# Patient Record
Sex: Male | Born: 1965 | Race: Black or African American | Hispanic: No | Marital: Single | Smoking: Former smoker
Health system: Southern US, Community
[De-identification: ages and names within clinical notes are randomized; demographics above are authoritative.]

## PROBLEM LIST (undated history)

## (undated) DIAGNOSIS — F1910 Other psychoactive substance abuse, uncomplicated: Secondary | ICD-10-CM

## (undated) DIAGNOSIS — I10 Essential (primary) hypertension: Secondary | ICD-10-CM

## (undated) DIAGNOSIS — F0390 Unspecified dementia without behavioral disturbance: Secondary | ICD-10-CM

## (undated) DIAGNOSIS — I639 Cerebral infarction, unspecified: Secondary | ICD-10-CM

## (undated) HISTORY — PX: APPENDECTOMY: SHX54

---

## 2020-12-23 ENCOUNTER — Other Ambulatory Visit: Payer: Self-pay

## 2020-12-23 ENCOUNTER — Encounter (HOSPITAL_COMMUNITY): Payer: Self-pay

## 2020-12-23 ENCOUNTER — Emergency Department (HOSPITAL_COMMUNITY): Payer: No Typology Code available for payment source

## 2020-12-23 ENCOUNTER — Emergency Department (HOSPITAL_COMMUNITY)
Admission: EM | Admit: 2020-12-23 | Discharge: 2020-12-23 | Disposition: A | Discharge status: Home / Self Care | Payer: No Typology Code available for payment source | Attending: Emergency Medicine | Admitting: Emergency Medicine

## 2020-12-23 DIAGNOSIS — R404 Transient alteration of awareness: Secondary | ICD-10-CM | POA: Insufficient documentation

## 2020-12-23 DIAGNOSIS — I10 Essential (primary) hypertension: Secondary | ICD-10-CM | POA: Insufficient documentation

## 2020-12-23 DIAGNOSIS — F0390 Unspecified dementia without behavioral disturbance: Secondary | ICD-10-CM | POA: Insufficient documentation

## 2020-12-23 DIAGNOSIS — Z87891 Personal history of nicotine dependence: Secondary | ICD-10-CM | POA: Insufficient documentation

## 2020-12-23 DIAGNOSIS — R4182 Altered mental status, unspecified: Secondary | ICD-10-CM | POA: Diagnosis present

## 2020-12-23 HISTORY — DX: Cerebral infarction, unspecified: I63.9

## 2020-12-23 HISTORY — DX: Other psychoactive substance abuse, uncomplicated: F19.10

## 2020-12-23 HISTORY — DX: Essential (primary) hypertension: I10

## 2020-12-23 LAB — AMMONIA: Ammonia: 21 umol/L (ref 9–35)

## 2020-12-23 LAB — CBC WITH DIFFERENTIAL/PLATELET
Abs Immature Granulocytes: 0.01 10*3/uL (ref 0.00–0.07)
Basophils Absolute: 0 10*3/uL (ref 0.0–0.1)
Basophils Relative: 0 %
Eosinophils Absolute: 0.1 10*3/uL (ref 0.0–0.5)
Eosinophils Relative: 1 %
HCT: 39.9 % (ref 39.0–52.0)
Hemoglobin: 12.6 g/dL — ABNORMAL LOW (ref 13.0–17.0)
Immature Granulocytes: 0 %
Lymphocytes Relative: 40 %
Lymphs Abs: 4 10*3/uL (ref 0.7–4.0)
MCH: 30.1 pg (ref 26.0–34.0)
MCHC: 31.6 g/dL (ref 30.0–36.0)
MCV: 95.5 fL (ref 80.0–100.0)
Monocytes Absolute: 0.8 10*3/uL (ref 0.1–1.0)
Monocytes Relative: 8 %
Neutro Abs: 5.2 10*3/uL (ref 1.7–7.7)
Neutrophils Relative %: 51 %
Platelets: 260 10*3/uL (ref 150–400)
RBC: 4.18 MIL/uL — ABNORMAL LOW (ref 4.22–5.81)
RDW: 13 % (ref 11.5–15.5)
WBC: 10.2 10*3/uL (ref 4.0–10.5)
nRBC: 0 % (ref 0.0–0.2)

## 2020-12-23 LAB — VALPROIC ACID LEVEL: Valproic Acid Lvl: <10 ug/mL — ABNORMAL LOW (ref 50.0–100.0)

## 2020-12-23 LAB — URINALYSIS, COMPLETE (UACMP) WITH MICROSCOPIC
Bacteria, UA: NONE SEEN
Bilirubin Urine: NEGATIVE
Glucose, UA: NEGATIVE mg/dL
Hgb urine dipstick: NEGATIVE
Ketones, ur: NEGATIVE mg/dL
Leukocytes,Ua: NEGATIVE
Nitrite: NEGATIVE
Protein, ur: NEGATIVE mg/dL
Specific Gravity, Urine: 1.009 (ref 1.005–1.030)
pH: 8 (ref 5.0–8.0)

## 2020-12-23 LAB — COMPREHENSIVE METABOLIC PANEL
ALT: 62 U/L — ABNORMAL HIGH (ref 0–44)
AST: 26 U/L (ref 15–41)
Albumin: 4.2 g/dL (ref 3.5–5.0)
Alkaline Phosphatase: 82 U/L (ref 38–126)
Anion gap: 6 (ref 5–15)
BUN: 15 mg/dL (ref 6–20)
CO2: 32 mmol/L (ref 22–32)
Calcium: 9.2 mg/dL (ref 8.9–10.3)
Chloride: 106 mmol/L (ref 98–111)
Creatinine, Ser: 0.84 mg/dL (ref 0.61–1.24)
GFR, Estimated: >60 mL/min (ref >60)
Glucose, Bld: 80 mg/dL (ref 70–99)
Potassium: 3.8 mmol/L (ref 3.5–5.1)
Sodium: 144 mmol/L (ref 135–145)
Total Bilirubin: 1.3 mg/dL — ABNORMAL HIGH (ref 0.3–1.2)
Total Protein: 7.1 g/dL (ref 6.5–8.1)

## 2020-12-23 LAB — RAPID URINE DRUG SCREEN, HOSP PERFORMED
Amphetamines: NOT DETECTED
Barbiturates: NOT DETECTED
Benzodiazepines: NOT DETECTED
Cocaine: NOT DETECTED
Opiates: NOT DETECTED
Tetrahydrocannabinol: NOT DETECTED

## 2020-12-23 LAB — CBG MONITORING, ED
Glucose-Capillary: 69 mg/dL — ABNORMAL LOW (ref 70–99)
Glucose-Capillary: 77 mg/dL (ref 70–99)

## 2020-12-23 LAB — ETHANOL: Alcohol, Ethyl (B): <10 mg/dL (ref <10)

## 2020-12-23 NOTE — Progress Notes (Addendum)
.  Transition of Care [facility]) - Emergency Department Mini Assessment   Patient Details  Name: Patrick Villarreal MRN: [id number] Date of Birth: 05-Aug-1966  Transition of Care [facility]) CM/SW Contact:    Elliot Cousin, RN Phone Number: [phone] 12/23/2020, 7:32 PM   Clinical Narrative: TOC CM spoke to pt and pt's sister, Kendal Hymen. Kendal Hymen states pt will be staying with her after his stroke. He moved from [place] to [facility] and she has been managing him at home. States she has attempted several times to establish care for him at [facility] but has not been able to get an appt. Explained they do have an ED at that center. Explained CM will fax his progress notes and facesheet to [facility] to have SW arrange follow up and arrange HH. ED provider updated.   Attempted call to [facility] CSW, Salvadore Dom [phone] ext [phone], no voice mail as it is after hours for that clinic.    ED Mini Assessment: What brought you to the Emergency Department? : AMS  Barriers to Discharge: No Barriers Identified  Barrier interventions: faxed referral to [facility] to arrange PCP, and Neurology appts  Means of departure: Car  Interventions which prevented an admission or readmission: Follow-up medical appointment    Patient Contact and Communications Key Contact 1: Laymond Purser   Spoke with: sister Contact Date: 12/23/20,   Contact time: 1926 Contact Phone Number: [phone]           Admission diagnosis:  AMS There are no problems to display for this patient.  PCP:  Pcp, No Pharmacy:  No Pharmacies Listed

## 2020-12-23 NOTE — Discharge Instructions (Addendum)
Follow-up with neurology, call to schedule an appointment.  [facility] [place] [facility]  Return to the emergency room for any worsening or concerning symptoms.

## 2020-12-23 NOTE — ED Provider Notes (Signed)
Physical Exam  BP (!) 176/102 Comment: Simultaneous filing. User may not have seen previous data.  Pulse 67 Comment: Simultaneous filing. User may not have seen previous data.  Temp 98.6 F (37 C) (Oral)   Resp 20 Comment: Simultaneous filing. User may not have seen previous data.  Ht 6\' 1"  (1.854 m)   Wt 86.2 kg   SpO2 100% Comment: Simultaneous filing. User may not have seen previous data.  BMI 25.07 kg/m   Physical Exam Vitals and nursing note reviewed.  Constitutional:      General: He is not in acute distress.    Appearance: He is well-developed. He is not diaphoretic.  HENT:     Head: Normocephalic and atraumatic.  Eyes:     General: No scleral icterus.    Conjunctiva/sclera: Conjunctivae normal.  Pulmonary:     Effort: Pulmonary effort is normal. No respiratory distress.  Musculoskeletal:     Cervical back: Normal range of motion.  Skin:    Findings: No rash.  Neurological:     Mental Status: He is alert.     ED Course/Procedures   Clinical Course as of 12/23/20 12/25/20 Dec 23, 2020  1232 55 year old male brought in by sister from home for change in mental status although arrives to the emergency room back to baseline today.  CT head shows old stroke, no acute findings.  Work-up today has been unrevealing including CBC, CMP, normal ammonia and negative ethanol.  Patient is pending UDS and throat level as well as UA to evaluate for infection. Case discussed with Dr. 59, ER attending, consider possible new seizure development due to prior stroke.  Plan is to refer to neurology for outpatient follow-up.  Discussed available results and plan of care with patient's sister at bedside.  Care signed out pending remaining lab work. [LM]  1826 Nitrite: NEGATIVE [HK]  1826 Leukocytes,Ua: NEGATIVE [HK]  1826 Bacteria, UA: NONE SEEN [HK]  1826 Valproic Acid,S(!): <10 [HK]  1835 Urine rapid drug screen (hosp performed) All negative [HK]    Clinical Course User  Index [HK] Anitra Lauth, PA-C [LM] Dietrich Pates, PA-C    Procedures  MDM   Care of patient assumed from [facility] at 6 PM.  Agree with history, physical exam and plan.  See their note for further details.  Briefly, 55 y.o. male with PMH/PSH as below who presents with change in mental status.  History of stroke 3 years ago.  Last seen normal at 10 PM last night.  Sister came home from work at 10 AM today and noticed that patient was out of bed, fully dressed with unusual behavior.  Was not following commands. At baseline patient has a history of dementia and has the mentality of a child but is able to complete simple daily tasks. Sister states that he is back to baseline now. CT of the head shows old stroke without any acute findings. Normal CBC, CMP, ammonia level.  Negative ethanol level.  Past Medical History:  Diagnosis Date  . Hypertension   . Stroke (HCC)   . Substance abuse [facility])    Past Surgical History:  Procedure Laterality Date  . APPENDECTOMY        Current Plan: Obtain Depakote level as well as UA, UDS to evaluate for cause of altered mental status.   MDM/ED Course: 6:26 PM UA without evidence of infection. UDS negative. Depakote level is low at <10, but evaluated this to make sure level was not elevated. Patient  and sister at the bedside are updated on plan of care.  We will have him follow-up with his primary care provider and return for worsening symptoms. Sister is requesting a TOC consult to be followed up outpatient regarding getting her in touch with a PCP.  Consults: None   Significant labs/images: Labs Reviewed  COMPREHENSIVE METABOLIC PANEL - Abnormal; Notable for the following components:      Result Value   ALT 62 (*)    Total Bilirubin 1.3 (*)    All other components within normal limits  CBC WITH DIFFERENTIAL/PLATELET - Abnormal; Notable for the following components:   RBC 4.18 (*)    Hemoglobin 12.6 (*)    All other components within  normal limits  URINALYSIS, COMPLETE (UACMP) WITH MICROSCOPIC - Abnormal; Notable for the following components:   Color, Urine STRAW (*)    All other components within normal limits  VALPROIC ACID LEVEL - Abnormal; Notable for the following components:   Valproic Acid Lvl <10 (*)    All other components within normal limits  CBG MONITORING, ED - Abnormal; Notable for the following components:   Glucose-Capillary 69 (*)    All other components within normal limits  AMMONIA  ETHANOL  RAPID URINE DRUG SCREEN, HOSP PERFORMED  CBG MONITORING, ED    I personally reviewed and interpreted all labs.   Patient is hemodynamically stable, in NAD. Evaluation does not show pathology that would require ongoing emergent intervention or inpatient treatment. I explained the diagnosis to the patient. Pain has been managed and has no complaints prior to discharge. Patient is comfortable with above plan and is stable for discharge at this time. All questions were answered prior to disposition. Strict return precautions for returning to the ED were discussed. Encouraged follow up with PCP.   An After Visit Summary was printed and given to the patient.   Portions of this note were generated with Scientist, clinical (histocompatibility and immunogenetics). Dictation errors may occur despite best attempts at proofreading.      Dietrich Pates, PA-C 12/23/20 Harl Bowie, MD 12/24/20 [phone]

## 2020-12-23 NOTE — ED Provider Notes (Signed)
[facility]-EMERGENCY DEPT Provider Note   CSN: [id number] Arrival date & time: 12/23/20  1408     History Chief Complaint  Patient presents with  . Altered Mental Status    Patrick Villarreal is a 55 y.o. male.  55 year old male with past medical history of dementia, hypertension, stroke, substance abuse (as per sister is aware no IV drug use), brought in by sister for change in mental status.  Patient had a stroke 3 years ago, was living in a care facility in [place], sister recently just went to [place] and relocated patient to her home within the past month.  At baseline, history states patient is aware of who she is, has the mentality of a child but is able to complete simple daily tasks.  Patient was last seen normal at 10:00 last night.  Sister came home from work at 10:00am today and states patient was out of bed, normally does not wake up until 1 PM.  Patient had put on pants, underwear, shoes and socks and found a jacket however did not have a T-shirt on which struck her as unusual but she did not think much of it and went back to work.  Sister turned home at 12:00 and states that patient seemed confused and was not following commands.  At this time, states patient is back to baseline. Level 5 caveat applies due to dementia, patient said ability to provide his own history today.        Past Medical History:  Diagnosis Date  . Hypertension   . Stroke (HCC)   . Substance abuse (HCC)     There are no problems to display for this patient.   Past Surgical History:  Procedure Laterality Date  . APPENDECTOMY         History reviewed. No pertinent family history.  Social History   Tobacco Use  . Smoking status: Former Games developer  . Smokeless tobacco: Never Used    Home Medications Prior to Admission medications   Medication Sig Start Date End Date Taking? Authorizing Provider  atorvastatin (LIPITOR) 80 MG tablet Take 80 mg by mouth every evening.  07/13/20 07/14/21 Yes [provider]  clopidogrel (PLAVIX) 75 MG tablet Take 1 tablet by mouth daily. 07/13/20 07/14/21 Yes [provider]  divalproex (DEPAKOTE SPRINKLE) 125 MG capsule Take 125 mg by mouth 3 (three) times daily. 11/04/20 11/05/21 Yes [provider]  ibuprofen (ADVIL) 400 MG tablet Take 400 mg by mouth 2 (two) times daily as needed for moderate pain. 11/30/20  Yes [provider]  losartan (COZAAR) 25 MG tablet Take 1 tablet by mouth daily. 07/13/20 07/14/21 Yes [provider]  potassium chloride SA (KLOR-CON) 20 MEQ tablet Take 1 tablet by mouth daily. 07/09/20 07/10/21 Yes [provider]  QUEtiapine (SEROQUEL) 100 MG tablet Take 0.5 tablets by mouth at bedtime. 10/29/20  Yes [provider]    Allergies    Shellfish allergy and Iodine  Review of Systems   Review of Systems  Unable to perform ROS: Dementia    Physical Exam Updated Vital Signs BP (!) 194/109   Pulse 70   Temp 98.6 F (37 C) (Oral)   Resp 18   Ht 6\' 1"  (1.854 m)   Wt 86.2 kg   SpO2 100%   BMI 25.07 kg/m   Physical Exam Vitals and nursing note reviewed.  Constitutional:      General: He is not in acute distress.    Appearance: He  is not ill-appearing.  HENT:     Head: Normocephalic and atraumatic.     Nose: Nose normal.     Mouth/Throat:     Mouth: Mucous membranes are moist.  Eyes:     Extraocular Movements: Extraocular movements intact.     Conjunctiva/sclera: Conjunctivae normal.     Pupils: Pupils are equal, round, and reactive to light.  Cardiovascular:     Rate and Rhythm: Normal rate and regular rhythm.     Pulses: Normal pulses.     Heart sounds: Normal heart sounds.  Pulmonary:     Effort: Pulmonary effort is normal.     Breath sounds: Normal breath sounds.  Abdominal:     Palpations: Abdomen is soft.     Tenderness: There is no abdominal tenderness.  Musculoskeletal:        General: No swelling or tenderness.  Normal range of motion.     Cervical back: Neck supple.     Right lower leg: No edema.     Left lower leg: No edema.  Skin:    General: Skin is warm and dry.  Neurological:     General: No focal deficit present.     Mental Status: He is alert. Mental status is at baseline.     Cranial Nerves: No facial asymmetry.     Motor: No weakness or pronator drift.  Psychiatric:        Mood and Affect: Mood normal.        Behavior: Behavior normal.     ED Results / Procedures / Treatments   Labs (all labs ordered are listed, but only abnormal results are displayed) Labs Reviewed  COMPREHENSIVE METABOLIC PANEL - Abnormal; Notable for the following components:      Result Value   ALT 62 (*)    Total Bilirubin 1.3 (*)    All other components within normal limits  CBC WITH DIFFERENTIAL/PLATELET - Abnormal; Notable for the following components:   RBC 4.18 (*)    Hemoglobin 12.6 (*)    All other components within normal limits  CBG MONITORING, ED - Abnormal; Notable for the following components:   Glucose-Capillary 69 (*)    All other components within normal limits  AMMONIA  ETHANOL  URINALYSIS, COMPLETE (UACMP) WITH MICROSCOPIC  RAPID URINE DRUG SCREEN, HOSP PERFORMED  VALPROIC ACID LEVEL  CBG MONITORING, ED    EKG EKG Interpretation  Date/Time:  Thursday December 23 2020 14:16:59 EDT Ventricular Rate:  69 PR Interval:    QRS Duration: 99 QT Interval:  401 QTC Calculation: 430 R Axis:   59 Text Interpretation: Sinus rhythm Prolonged PR interval ST elevation, consider anterior injury No previous tracing Confirmed by Gwyneth Sprout ([id number]) on 12/23/2020 3:41:57 PM   Radiology CT HEAD WO CONTRAST  Result Date: 12/23/2020 CLINICAL DATA:  Altered mental status EXAM: CT HEAD WITHOUT CONTRAST TECHNIQUE: Contiguous axial images were obtained from the base of the skull through the vertex without intravenous contrast. COMPARISON:  None. FINDINGS: Brain: There is mild diffuse atrophy.  There is no intracranial mass, hemorrhage, extra-axial fluid collection, midline shift. There is evidence of an apparent prior infarct on the left involving portions of the posterior temporal lobe as well as portions of the anterior and mid left occipital lobe with extension of infarct to involve the mid left parietal lobe. There is evidence of a prior infarct involving the anterior right lentiform nucleus as well as portions of the adjacent internal and external capsules on the right. Elsewhere there  is mild small vessel disease in the centra semiovale bilaterally. No acute appearing infarct is demonstrable on this study. Vascular: No hyperdense vessel. No appreciable vascular calcification. Skull: Bony calvarium appears intact. Sinuses/Orbits: There is a small retention cyst along the anterior left maxillary antrum. There is also a retention cyst in the posterior left sphenoid sinus. Orbits appear symmetric bilaterally. Other: Mastoid air cells are clear. IMPRESSION: Mild diffuse atrophy. Prior infarct involving portions of the posterior left temporal lobe, anterior to mid left occipital lobe, and midportion of the left parietal lobe. Localized atrophy in this area noted. Prior infarct in the right lentiform nucleus anteriorly with involvement of portions of the internal and external capsules on the right. Periventricular small vessel disease noted. No acute infarct appreciable. No mass or hemorrhage. Scattered foci of paranasal sinus disease noted. Electronically Signed   By: Bretta Bang III M.D.   On: 12/23/2020 15:21   DG Chest Port 1 View  Result Date: 12/23/2020 CLINICAL DATA:  Altered mental status EXAM: PORTABLE CHEST 1 VIEW COMPARISON:  None. FINDINGS: There are scattered small calcified granulomas. No edema or airspace opacity. The heart size and pulmonary vascularity are normal. No adenopathy. No bone lesions. IMPRESSION: Several scattered small calcified granulomas. Lungs elsewhere clear.  Heart size normal. Electronically Signed   By: Bretta Bang III M.D.   On: 12/23/2020 15:22    Procedures Procedures   Medications Ordered in ED Medications - No data to display  ED Course  I have reviewed the triage vital signs and the nursing notes.  Pertinent labs & imaging results that were available during my care of the patient were reviewed by me and considered in my medical decision making (see chart for details).  Clinical Course as of 12/23/20 1756  Thu Dec 23, 2020  [id number] 55 year old male brought in by sister from home for change in mental status although arrives to the emergency room back to baseline today.  CT head shows old stroke, no acute findings.  Work-up today has been unrevealing including CBC, CMP, normal ammonia and negative ethanol.  Patient is pending UDS and throat level as well as UA to evaluate for infection. Case discussed with Dr. Anitra Lauth, ER attending, consider possible new seizure development due to prior stroke.  Plan is to refer to neurology for outpatient follow-up.  Discussed available results and plan of care with patient's sister at bedside.  Care signed out pending remaining lab work. [LM]    Clinical Course User Index [LM] Alden Hipp   MDM Rules/Calculators/A&P                          Final Clinical Impression(s) / ED Diagnoses Final diagnoses:  Transient alteration of awareness    Rx / DC Orders ED Discharge Orders    None       Jeannie Fend, PA-C 12/23/20 1756    Gwyneth Sprout, MD 12/27/20 [phone]

## 2020-12-23 NOTE — ED Notes (Signed)
Milk provided and swallow screen passed.

## 2020-12-23 NOTE — ED Notes (Signed)
PA in room at this time 

## 2020-12-23 NOTE — ED Triage Notes (Addendum)
Per sister, patient has hx of stroke. Sister said he was baseline when he went to bed at 2200 and upon waking this am patient dressed hisself. Sister come home from lunch at 1200 and pt was confused, shaking, not following commands.

## 2020-12-23 NOTE — ED Notes (Signed)
Called social worker to make aware of consult. She is okay with patient being discharged. She will follow up

## 2020-12-24 NOTE — Progress Notes (Signed)
12/24/2020 @ 11:40am  TOC CM CSW received a call from Amy [facility]att[facility].  Amy stated that [facility] would accept pt and pass this message onto Cathlean Cower, Charity fundraiser.  CSW provided Cathlean Cower, RN with this information.  CSW will continue to follow for dc needs.  Tashiba Timoney Tarpley-Carter, MSW, LCSW-A Pronouns:  She, Her, Hers                  [facility] Transitions of CareClinical Social Worker [email or web address] [phone]

## 2021-02-22 ENCOUNTER — Emergency Department (HOSPITAL_BASED_OUTPATIENT_CLINIC_OR_DEPARTMENT_OTHER): Payer: No Typology Code available for payment source

## 2021-02-22 ENCOUNTER — Emergency Department (HOSPITAL_BASED_OUTPATIENT_CLINIC_OR_DEPARTMENT_OTHER)
Admission: EM | Admit: 2021-02-22 | Discharge: 2021-02-22 | Discharge status: Against Medical Advice | Payer: No Typology Code available for payment source | Attending: Emergency Medicine | Admitting: Emergency Medicine

## 2021-02-22 ENCOUNTER — Other Ambulatory Visit: Payer: Self-pay

## 2021-02-22 ENCOUNTER — Encounter (HOSPITAL_BASED_OUTPATIENT_CLINIC_OR_DEPARTMENT_OTHER): Payer: Self-pay | Admitting: Emergency Medicine

## 2021-02-22 DIAGNOSIS — Z87891 Personal history of nicotine dependence: Secondary | ICD-10-CM | POA: Diagnosis not present

## 2021-02-22 DIAGNOSIS — Z7902 Long term (current) use of antithrombotics/antiplatelets: Secondary | ICD-10-CM | POA: Diagnosis not present

## 2021-02-22 DIAGNOSIS — F0390 Unspecified dementia without behavioral disturbance: Secondary | ICD-10-CM | POA: Diagnosis not present

## 2021-02-22 DIAGNOSIS — H539 Unspecified visual disturbance: Secondary | ICD-10-CM

## 2021-02-22 DIAGNOSIS — Z79899 Other long term (current) drug therapy: Secondary | ICD-10-CM | POA: Insufficient documentation

## 2021-02-22 DIAGNOSIS — H538 Other visual disturbances: Secondary | ICD-10-CM | POA: Diagnosis present

## 2021-02-22 DIAGNOSIS — I10 Essential (primary) hypertension: Secondary | ICD-10-CM | POA: Diagnosis not present

## 2021-02-22 LAB — DIFFERENTIAL
Abs Immature Granulocytes: 0.02 10*3/uL (ref 0.00–0.07)
Basophils Absolute: 0.1 10*3/uL (ref 0.0–0.1)
Basophils Relative: 1 %
Eosinophils Absolute: 0.2 10*3/uL (ref 0.0–0.5)
Eosinophils Relative: 2 %
Immature Granulocytes: 0 %
Lymphocytes Relative: 40 %
Lymphs Abs: 3.4 10*3/uL (ref 0.7–4.0)
Monocytes Absolute: 0.7 10*3/uL (ref 0.1–1.0)
Monocytes Relative: 8 %
Neutro Abs: 4.2 10*3/uL (ref 1.7–7.7)
Neutrophils Relative %: 49 %

## 2021-02-22 LAB — COMPREHENSIVE METABOLIC PANEL
ALT: 24 U/L (ref 0–44)
AST: 15 U/L (ref 15–41)
Albumin: 4.5 g/dL (ref 3.5–5.0)
Alkaline Phosphatase: 66 U/L (ref 38–126)
Anion gap: 8 (ref 5–15)
BUN: 18 mg/dL (ref 6–20)
CO2: 33 mmol/L — ABNORMAL HIGH (ref 22–32)
Calcium: 9.6 mg/dL (ref 8.9–10.3)
Chloride: 101 mmol/L (ref 98–111)
Creatinine, Ser: 0.84 mg/dL (ref 0.61–1.24)
GFR, Estimated: >60 mL/min (ref >60)
Glucose, Bld: 96 mg/dL (ref 70–99)
Potassium: 3.4 mmol/L — ABNORMAL LOW (ref 3.5–5.1)
Sodium: 142 mmol/L (ref 135–145)
Total Bilirubin: 1 mg/dL (ref 0.3–1.2)
Total Protein: 7.4 g/dL (ref 6.5–8.1)

## 2021-02-22 LAB — CBC
HCT: 43.5 % (ref 39.0–52.0)
Hemoglobin: 13.8 g/dL (ref 13.0–17.0)
MCH: 29.7 pg (ref 26.0–34.0)
MCHC: 31.7 g/dL (ref 30.0–36.0)
MCV: 93.8 fL (ref 80.0–100.0)
Platelets: 270 10*3/uL (ref 150–400)
RBC: 4.64 MIL/uL (ref 4.22–5.81)
RDW: 12.5 % (ref 11.5–15.5)
WBC: 8.4 10*3/uL (ref 4.0–10.5)
nRBC: 0 % (ref 0.0–0.2)

## 2021-02-22 LAB — APTT: aPTT: 34 seconds (ref 24–36)

## 2021-02-22 LAB — CBG MONITORING, ED: Glucose-Capillary: 96 mg/dL (ref 70–99)

## 2021-02-22 LAB — PROTIME-INR
INR: 1 (ref 0.8–1.2)
Prothrombin Time: 13.2 seconds (ref 11.4–15.2)

## 2021-02-22 MED ORDER — SODIUM CHLORIDE 0.9% FLUSH
3.0000 mL | Freq: Once | INTRAVENOUS | Status: AC
  Administered 2021-02-22: 3 mL via INTRAVENOUS
  Filled 2021-02-22: qty 3

## 2021-02-22 NOTE — ED Notes (Signed)
Pt sister stated she would like to go to the [place] tomorrow due to long wait time. Notified provider to speak with them.

## 2021-02-22 NOTE — ED Notes (Signed)
Sister states patient has had "vision" changes on Saturday which resolved and then again yesterday.  States just not able to see as well as usual.  Say patient also had some weakness and was very tired on Saturday.  Patient normally confused and has history of dementia and past stroke.

## 2021-02-22 NOTE — ED Triage Notes (Signed)
Pt arrives to ED accompanied by sister with c/o of episodes of blurred vision yesterday and today. Pt reports no blurred vision at this time. Pt is poor historian and unable to provide history of his own due to hx of dementia and stroke. Pt is unable to give exact LKN or how long these episodes of blurred vision lasted. NO weakness noted.

## 2021-02-22 NOTE — ED Notes (Signed)
Patient transported to CT 

## 2021-02-22 NOTE — ED Provider Notes (Addendum)
[facility] EMERGENCY DEPT Provider Note   CSN: [id number] Arrival date & time: 02/22/21  1301     History Chief Complaint  Patient presents with  . Blurred Vision    Patrick Villarreal is a 55 y.o. male.  HPI   55 year old male with past medical history of HTN, multiple strokes in the past with unknown residual deficits, substance abuse, dementia presents to the emergency department with concern for vision changes.  Patient is a very poor historian, story is inconsistent and sometimes he just looks to his sister to answer the questions.  He is not fully cooperative.  The sister at bedside states that for the past couple days he has intermittently been saying that he cannot see it.  He has not specified whether this is vision loss or blurred vision.  He is also not specified to her if this is in 1 eye or both eyes.  He has not been complaining of any headache or nausea/vomiting.  Has not had any noted fever.  Past Medical History:  Diagnosis Date  . Hypertension   . Stroke (HCC)   . Substance abuse (HCC)     There are no problems to display for this patient.   Past Surgical History:  Procedure Laterality Date  . APPENDECTOMY         History reviewed. No pertinent family history.  Social History   Tobacco Use  . Smoking status: Former Games developer  . Smokeless tobacco: Never Used    Home Medications Prior to Admission medications   Medication Sig Start Date End Date Taking? Authorizing Provider  atorvastatin (LIPITOR) 80 MG tablet Take 80 mg by mouth every evening. 07/13/20 07/14/21  [provider]  clopidogrel (PLAVIX) 75 MG tablet Take 1 tablet by mouth daily. 07/13/20 07/14/21  [provider]  divalproex (DEPAKOTE SPRINKLE) 125 MG capsule Take 125 mg by mouth 3 (three) times daily. 11/04/20 11/05/21  [provider]  ibuprofen (ADVIL) 400 MG tablet Take 400 mg by mouth 2 (two) times daily as needed for moderate pain. 11/30/20    [provider]  losartan (COZAAR) 25 MG tablet Take 1 tablet by mouth daily. 07/13/20 07/14/21  [provider]  potassium chloride SA (KLOR-CON) 20 MEQ tablet Take 1 tablet by mouth daily. 07/09/20 07/10/21  [provider]  QUEtiapine (SEROQUEL) 100 MG tablet Take 0.5 tablets by mouth at bedtime. 10/29/20   [provider]    Allergies    Shellfish allergy and Iodine  Review of Systems   Review of Systems  Unable to perform ROS: Dementia    Physical Exam Updated Vital Signs BP (!) 178/100   Pulse 70   Temp 98.2 F (36.8 C) (Oral)   Resp 13   Ht 6\' 1"  (1.854 m)   Wt 90.7 kg   SpO2 100%   BMI 26.39 kg/m   Physical Exam Vitals and nursing note reviewed.  Constitutional:      Appearance: Normal appearance.  HENT:     Head: Normocephalic.     Mouth/Throat:     Mouth: Mucous membranes are moist.  Eyes:     Pupils: Pupils are equal, round, and reactive to light.     Comments: Patient appears to respond to visual threat bilaterally, his extraocular movements are full, he localizes to my face when I speak, is able to identify many fingers I am holding up, does not appear to have any acute visual deficit  Cardiovascular:     Rate and  Rhythm: Normal rate.  Pulmonary:     Effort: Pulmonary effort is normal. No respiratory distress.  Abdominal:     Palpations: Abdomen is soft.     Tenderness: There is no abdominal tenderness.  Skin:    General: Skin is warm.  Neurological:     Mental Status: He is alert. Mental status is at baseline.     Comments: Sister states that the patient is in his baseline demented status, he is confused, knows his name but then does not cooperate with the rest of the orientation exam, intermittently follows commands, appears to have vision intact at this time.  Psychiatric:        Mood and Affect: Mood normal.     ED Results / Procedures / Treatments   Labs (all labs ordered are listed, but only abnormal results  are displayed) Labs Reviewed  COMPREHENSIVE METABOLIC PANEL - Abnormal; Notable for the following components:      Result Value   Potassium 3.4 (*)    CO2 33 (*)    All other components within normal limits  PROTIME-INR  APTT  CBC  DIFFERENTIAL  CBG MONITORING, ED  CBG MONITORING, ED    EKG EKG Interpretation  Date/Time:  Tuesday Feb 22 2021 13:09:52 EDT Ventricular Rate:  79 PR Interval:  219 QRS Duration: 95 QT Interval:  380 QTC Calculation: 436 R Axis:   37 Text Interpretation: Sinus rhythm Prolonged PR interval Probable left ventricular hypertrophy ST elevation, consider anterior injury NSR, STE present before Confirmed by Coralee Pesa [phone]) on 02/22/2021 1:29:08 PM   Radiology CT HEAD WO CONTRAST  Result Date: 02/22/2021 CLINICAL DATA:  Neuro deficit, acute, stroke suspected. Additional history provided: Blurred vision. EXAM: CT HEAD WITHOUT CONTRAST TECHNIQUE: Contiguous axial images were obtained from the base of the skull through the vertex without intravenous contrast. COMPARISON:  Prior head CT 12/23/2020. FINDINGS: Brain: Mild generalized parenchymal atrophy. Redemonstrated chronic cortical/subcortical infarct within the left parietal, temporal and occipital lobes. Ex vacuo dilatation of the left lateral ventricle. Redemonstrated chronic infarct within the right corona radiata, basal ganglia and internal capsule. Background mild ill-defined hypoattenuation within the cerebral white matter, nonspecific but compatible with chronic small vessel ischemic disease. There is no acute intracranial hemorrhage. No acute demarcated cortical infarct. No extra-axial fluid collection. No evidence of intracranial mass. No midline shift. Vascular: No hyperdense vessel. Skull: Normal. Negative for fracture or focal lesion. Sinuses/Orbits: Visualized orbits show no acute finding. Small mucous retention cysts within the right frontal and left ethmoid sinuses. Background trace scattered  mucosal thickening within the bilateral ethmoid sinuses. Trace left sphenoid sinus mucosal thickening. Trace mucosal thickening within the bilateral maxillary sinuses at the imaged levels. IMPRESSION: No evidence of acute intracranial abnormality. Redemonstrated chronic cortical/subcortical left MCA territory infarct within the left parietal, temporal and occipital lobes. Redemonstrated chronic infarct within the right corona radiata, basal ganglia and internal capsule. Stable background mild generalized parenchymal atrophy and cerebral white matter chronic small vessel ischemic disease. Paranasal sinus disease, as described. Electronically Signed   By: Jackey Loge DO   On: 02/22/2021 14:19    Procedures Procedures   Medications Ordered in ED Medications  sodium chloride flush (NS) 0.9 % injection 3 mL (3 mLs Intravenous Given 02/22/21 1327)    ED Course  I have reviewed the triage vital signs and the nursing notes.  Pertinent labs & imaging results that were available during my care of the patient were reviewed by me and considered in my medical  decision making (see chart for details).    MDM Rules/Calculators/A&P                          55 year old male presents the emergency department with reported vision changes.  He has an extensive history of strokes.  The patient is a very poor historian, intermittently following commands, at times rude or just uncooperative.  Sister at bedside states over the past couple days he is intermittently been saying that he could not see, but then the sister states that it seems to resolve because he operates an has been functioning normally.  It is unclear if this is 1 eye, both eyes, vision loss or blurred vision.  When I ask him about earlier this morning when he told his sister that he could not see if he remembered this he said yes, when I asked him to describe this event he says no.  When I asked with vision changes in 1 or both eyes he says both eyes.   He appears to respond to visual threat bilaterally, localizes to my face no problem, identifies things on visual acuity.  Does not appear to have any acute vision change at this time.  He does not appear to have any other focal deficit, he ambulated to the bathroom with assistance with his sister which she states is baseline.  Work-up here is reassuring, head CT shows old strokes without any acute findings.  However given his history with comorbidities and possible vision changes plan to transfer to [facility] for MRI for further evaluation.  Lower suspicion for an ocular cause given that his complaints is both eyes and his exam here is unremarkable.  Dr. Myrtis Ser is the accepting physician.  Patient stable at time of transfer.  Patient is going by private vehicle.  Final Clinical Impression(s) / ED Diagnoses Final diagnoses:  Vision changes    Rx / DC Orders ED Discharge Orders    None       Rozelle Logan, DO 02/22/21 1537    Amandine Covino, Clabe Seal, DO 02/22/21 1540

## 2021-02-22 NOTE — ED Notes (Signed)
Handoff report given to Alycia Rossetti RN at [facility] ER for transfer

## 2021-02-22 NOTE — ED Notes (Signed)
Patient transfer to [facility] ER Accepting by Dr. Myrtis Ser.  Sister taking patient by private car.

## 2021-02-22 NOTE — ED Provider Notes (Signed)
Patient seen upon arrival to [facility], ED.  Patient was sent from [facility] for MRI of brain.  Patient is currently without acute complaint.  Specifically, patient is currently denying visual change or loss.  Patient's family (sister) is at bedside.  She reports that the patient is at his neurologic baseline.   MRI Brain pending.     1800 Patient is still awaiting MRI.  Patient and his sister do not want to wait any further.  They desire AMA.  They understand the risk inherent in leaving prior to completion of ED work-up.  They have plan for follow-up with the PCP at the [facility] system.  They also have already scheduled outpatient follow-up with optometry within the [place] system as well.  At time of leaving AMA the patient is reporting no symptoms.   Wynetta Fines, MD 02/22/21 [phone]

## 2021-02-22 NOTE — Discharge Instructions (Addendum)
Return for any problem.  ?

## 2021-05-18 ENCOUNTER — Emergency Department (HOSPITAL_COMMUNITY)
Admission: EM | Admit: 2021-05-18 | Discharge: 2021-05-19 | Disposition: A | Discharge status: Home / Self Care | Payer: No Typology Code available for payment source | Attending: Emergency Medicine | Admitting: Emergency Medicine

## 2021-05-18 ENCOUNTER — Encounter (HOSPITAL_COMMUNITY): Payer: Self-pay

## 2021-05-18 ENCOUNTER — Other Ambulatory Visit: Payer: Self-pay

## 2021-05-18 DIAGNOSIS — Z7902 Long term (current) use of antithrombotics/antiplatelets: Secondary | ICD-10-CM | POA: Diagnosis not present

## 2021-05-18 DIAGNOSIS — Z87891 Personal history of nicotine dependence: Secondary | ICD-10-CM | POA: Insufficient documentation

## 2021-05-18 DIAGNOSIS — Z79899 Other long term (current) drug therapy: Secondary | ICD-10-CM | POA: Insufficient documentation

## 2021-05-18 DIAGNOSIS — R319 Hematuria, unspecified: Secondary | ICD-10-CM | POA: Diagnosis present

## 2021-05-18 DIAGNOSIS — I10 Essential (primary) hypertension: Secondary | ICD-10-CM | POA: Diagnosis not present

## 2021-05-18 DIAGNOSIS — N3001 Acute cystitis with hematuria: Secondary | ICD-10-CM | POA: Insufficient documentation

## 2021-05-18 LAB — COMPREHENSIVE METABOLIC PANEL
ALT: 26 U/L (ref 0–44)
AST: 20 U/L (ref 15–41)
Albumin: 4.3 g/dL (ref 3.5–5.0)
Alkaline Phosphatase: 74 U/L (ref 38–126)
Anion gap: 8 (ref 5–15)
BUN: 19 mg/dL (ref 6–20)
CO2: 32 mmol/L (ref 22–32)
Calcium: 9.2 mg/dL (ref 8.9–10.3)
Chloride: 103 mmol/L (ref 98–111)
Creatinine, Ser: 0.91 mg/dL (ref 0.61–1.24)
GFR, Estimated: >60 mL/min (ref >60)
Glucose, Bld: 121 mg/dL — ABNORMAL HIGH (ref 70–99)
Potassium: 3.1 mmol/L — ABNORMAL LOW (ref 3.5–5.1)
Sodium: 143 mmol/L (ref 135–145)
Total Bilirubin: 2.2 mg/dL — ABNORMAL HIGH (ref 0.3–1.2)
Total Protein: 8 g/dL (ref 6.5–8.1)

## 2021-05-18 LAB — CBC WITH DIFFERENTIAL/PLATELET
Abs Immature Granulocytes: 0.03 10*3/uL (ref 0.00–0.07)
Basophils Absolute: 0 10*3/uL (ref 0.0–0.1)
Basophils Relative: 0 %
Eosinophils Absolute: 0 10*3/uL (ref 0.0–0.5)
Eosinophils Relative: 0 %
HCT: 40.4 % (ref 39.0–52.0)
Hemoglobin: 12.8 g/dL — ABNORMAL LOW (ref 13.0–17.0)
Immature Granulocytes: 0 %
Lymphocytes Relative: 36 %
Lymphs Abs: 3.7 10*3/uL (ref 0.7–4.0)
MCH: 30.1 pg (ref 26.0–34.0)
MCHC: 31.7 g/dL (ref 30.0–36.0)
MCV: 95.1 fL (ref 80.0–100.0)
Monocytes Absolute: 1.4 10*3/uL — ABNORMAL HIGH (ref 0.1–1.0)
Monocytes Relative: 13 %
Neutro Abs: 5.2 10*3/uL (ref 1.7–7.7)
Neutrophils Relative %: 51 %
Platelets: 289 10*3/uL (ref 150–400)
RBC: 4.25 MIL/uL (ref 4.22–5.81)
RDW: 12.3 % (ref 11.5–15.5)
WBC: 10.4 10*3/uL (ref 4.0–10.5)
nRBC: 0 % (ref 0.0–0.2)

## 2021-05-18 LAB — URINALYSIS, ROUTINE W REFLEX MICROSCOPIC
Bacteria, UA: NONE SEEN
Bilirubin Urine: NEGATIVE
Glucose, UA: NEGATIVE mg/dL
Hgb urine dipstick: NEGATIVE
Ketones, ur: NEGATIVE mg/dL
Leukocytes,Ua: NEGATIVE
Nitrite: POSITIVE — AB
Protein, ur: NEGATIVE mg/dL
Specific Gravity, Urine: 1.021 (ref 1.005–1.030)
pH: 6 (ref 5.0–8.0)

## 2021-05-18 NOTE — ED Triage Notes (Signed)
Pt has dementia; sister (caretaker) is at bedside. Sister states that pt is peeing out blood since Monday and it is getting worse. She tried some over the counter medications and cranberry juice.She states that this has happened before and it was a UTI.

## 2021-05-19 MED ORDER — CEPHALEXIN 500 MG PO CAPS: 500.0000 mg | ORAL_CAPSULE | Freq: Three times a day (TID) | ORAL | 0 refills | Status: DC

## 2021-05-19 MED ORDER — CEPHALEXIN 500 MG PO CAPS
500.0000 mg | ORAL_CAPSULE | Freq: Once | ORAL | Status: AC
  Administered 2021-05-19: 500 mg via ORAL
  Filled 2021-05-19: qty 1

## 2021-05-19 NOTE — Discharge Instructions (Addendum)
Take the prescribed medication as directed.  Continue with good oral hydration at home. Follow-up with primary care doctor. Return to the ED for new or worsening symptoms-- confusion, high fevers, abdominal pain, etc.

## 2021-05-19 NOTE — ED Provider Notes (Signed)
[facility]-EMERGENCY DEPT Provider Note   CSN: [id number] Arrival date & time: 05/18/21  1859     History Chief Complaint  Patient presents with   UTI    Patrick Villarreal is a 55 y.o. male.  The history is provided by the patient and medical records.   55 year old male with history of hypertension, prior stroke, substance abuse, dementia, presenting to the ED for suspected UTI.  Sister at bedside providing additional history, she is his primary caretaker.  States on Monday he began having hematuria and urine appeared cloudy in color.  She tried giving him over-the-counter Pedialyte and cranberry juice which has helped in the past but reports symptoms did not improve.  He has not had any fever and has not complained of any abdominal or flank pain.  She does report history of UTI in the past with similar symptoms.  Past Medical History:  Diagnosis Date   Hypertension    Stroke [facility])    Substance abuse (HCC)     There are no problems to display for this patient.   Past Surgical History:  Procedure Laterality Date   APPENDECTOMY         History reviewed. No pertinent family history.  Social History   Tobacco Use   Smoking status: Former   Smokeless tobacco: Never    Home Medications Prior to Admission medications   Medication Sig Start Date End Date Taking? Authorizing Provider  cephALEXin (KEFLEX) 500 MG capsule Take 1 capsule (500 mg total) by mouth 3 (three) times daily. 05/19/21  Yes Garlon Hatchet, PA-C  atorvastatin (LIPITOR) 80 MG tablet Take 80 mg by mouth every evening. 07/13/20 07/14/21  [provider]  clopidogrel (PLAVIX) 75 MG tablet Take 1 tablet by mouth daily. 07/13/20 07/14/21  [provider]  divalproex (DEPAKOTE SPRINKLE) 125 MG capsule Take 125 mg by mouth 3 (three) times daily. 11/04/20 11/05/21  [provider]  ibuprofen (ADVIL) 400 MG tablet Take 400 mg by mouth 2 (two) times daily as needed for  moderate pain. 11/30/20   [provider]  losartan (COZAAR) 25 MG tablet Take 1 tablet by mouth daily. 07/13/20 07/14/21  [provider]  potassium chloride SA (KLOR-CON) 20 MEQ tablet Take 1 tablet by mouth daily. 07/09/20 07/10/21  [provider]  QUEtiapine (SEROQUEL) 100 MG tablet Take 0.5 tablets by mouth at bedtime. 10/29/20   [provider]    Allergies    Shellfish allergy and Iodine  Review of Systems   Review of Systems  Genitourinary:  Positive for hematuria.  All other systems reviewed and are negative.  Physical Exam Updated Vital Signs BP (!) 158/111 (BP Location: Left Arm)   Pulse 89   Temp 98 F (36.7 C) (Oral)   Resp 16   Ht 6' (1.829 m)   Wt 108.9 kg   SpO2 98%   BMI 32.55 kg/m   Physical Exam Vitals and nursing note reviewed.  Constitutional:      Appearance: He is well-developed.  HENT:     Head: Normocephalic and atraumatic.  Eyes:     Conjunctiva/sclera: Conjunctivae normal.     Pupils: Pupils are equal, round, and reactive to light.  Cardiovascular:     Rate and Rhythm: Normal rate and regular rhythm.     Heart sounds: Normal heart sounds.  Pulmonary:     Effort: Pulmonary effort is normal. No respiratory distress.     Breath sounds: Normal breath sounds. No rhonchi.  Abdominal:     General: Bowel sounds are normal.     Palpations: Abdomen is soft.     Tenderness: There is no abdominal tenderness. There is no rebound.     Comments: No focal tenderness  Musculoskeletal:        General: Normal range of motion.     Cervical back: Normal range of motion.  Skin:    General: Skin is warm and dry.  Neurological:     Mental Status: He is alert and oriented to person, place, and time.    ED Results / Procedures / Treatments   Labs (all labs ordered are listed, but only abnormal results are displayed) Labs Reviewed  CBC WITH DIFFERENTIAL/PLATELET - Abnormal; Notable for the following components:      Result  Value   Hemoglobin 12.8 (*)    Monocytes Absolute 1.4 (*)    All other components within normal limits  COMPREHENSIVE METABOLIC PANEL - Abnormal; Notable for the following components:   Potassium 3.1 (*)    Glucose, Bld 121 (*)    Total Bilirubin 2.2 (*)    All other components within normal limits  URINALYSIS, ROUTINE W REFLEX MICROSCOPIC - Abnormal; Notable for the following components:   Color, Urine AMBER (*)    Nitrite POSITIVE (*)    All other components within normal limits  URINE CULTURE    EKG None  Radiology No results found.  Procedures Procedures   Medications Ordered in ED Medications  cephALEXin (KEFLEX) capsule 500 mg (500 mg Oral Given 05/19/21 0429)    ED Course  I have reviewed the triage vital signs and the nursing notes.  Pertinent labs & imaging results that were available during my care of the patient were reviewed by me and considered in my medical decision making (see chart for details).    MDM Rules/Calculators/A&P                           55 year old male presenting to the ED with hematuria for the past 3 days.  Sister who is his caretaker reports similar symptoms in the past with UTI.  She tried home remedies without much change.  He is afebrile and nontoxic in appearance here.  He denies any pain at present and appears comfortable.  Labs overall reassuring, normal white count and renal function.  UA is nitrite positive.  Culture pending, will start on course of Keflex.  Encouraged good oral hydration at home.  Follow-up with PCP.  Return here for new concerns.  Final Clinical Impression(s) / ED Diagnoses Final diagnoses:  Acute cystitis with hematuria    Rx / DC Orders ED Discharge Orders          Ordered    cephALEXin (KEFLEX) 500 MG capsule  3 times daily        05/19/21 0423             Garlon Hatchet, PA-C 05/19/21 0437    Molpus, Jonny Ruiz, MD 05/19/21 [phone]

## 2021-05-20 LAB — URINE CULTURE: Culture: <10000 — AB

## 2021-10-08 ENCOUNTER — Emergency Department (HOSPITAL_BASED_OUTPATIENT_CLINIC_OR_DEPARTMENT_OTHER)
Admission: EM | Admit: 2021-10-08 | Discharge: 2021-10-08 | Disposition: A | Discharge status: Home / Self Care | Payer: No Typology Code available for payment source | Attending: Emergency Medicine | Admitting: Emergency Medicine

## 2021-10-08 ENCOUNTER — Other Ambulatory Visit: Payer: Self-pay

## 2021-10-08 ENCOUNTER — Encounter (HOSPITAL_BASED_OUTPATIENT_CLINIC_OR_DEPARTMENT_OTHER): Payer: Self-pay | Admitting: *Deleted

## 2021-10-08 DIAGNOSIS — K0889 Other specified disorders of teeth and supporting structures: Secondary | ICD-10-CM | POA: Diagnosis present

## 2021-10-08 DIAGNOSIS — K047 Periapical abscess without sinus: Secondary | ICD-10-CM | POA: Insufficient documentation

## 2021-10-08 DIAGNOSIS — I10 Essential (primary) hypertension: Secondary | ICD-10-CM | POA: Diagnosis not present

## 2021-10-08 MED ORDER — AMOXICILLIN-POT CLAVULANATE 875-125 MG PO TABS: 1.0000 | ORAL_TABLET | Freq: Two times a day (BID) | ORAL | 0 refills | Status: DC

## 2021-10-08 NOTE — Discharge Instructions (Addendum)
You were seen in the emergency department today for dental pain.  I think you likely have a dental abscess and I am putting you on antibiotics. You will take this twice daily for 7 days.  It is important you follow up with a dentist!  Continue to monitor how you're doing and return to the ER for new or worsening symptoms such as difficulty swallowing, difficulty breathing, or fever.   It has been a pleasure seeing and caring for you today and I hope you start feeling better soon!

## 2021-10-08 NOTE — ED Provider Notes (Signed)
[facility] EMERGENCY DEPT Provider Note   CSN: [id number] Arrival date & time: 10/08/21  1212     History  Chief Complaint  Patient presents with   Dental Pain    Patrick Villarreal is a 56 y.o. male with history of hypertension and stroke who presents emergency department complaining of left facial and dental pain since this morning.  Brought in by sister who reports she believes patient has a dental abscess.  He does not have a dentist currently, but he is a patient of the [place].  He denies fevers, difficulty swallowing, occultly breathing, nausea, vomiting, diarrhea.   Dental Pain Associated symptoms: facial swelling   Associated symptoms: no congestion, no drooling and no fever      Past Medical History:  Diagnosis Date   Hypertension    Stroke (HCC)    Substance abuse (HCC)      Home Medications Prior to Admission medications   Medication Sig Start Date End Date Taking? Authorizing Provider  amoxicillin-clavulanate (AUGMENTIN) 875-125 MG tablet Take 1 tablet by mouth every 12 (twelve) hours. 10/08/21  Yes Reynoldo Mainer T, PA-C  atorvastatin (LIPITOR) 80 MG tablet Take 80 mg by mouth every evening. 07/13/20 07/14/21  [provider]  cephALEXin (KEFLEX) 500 MG capsule Take 1 capsule (500 mg total) by mouth 3 (three) times daily. 05/19/21   Garlon Hatchet, PA-C  divalproex (DEPAKOTE SPRINKLE) 125 MG capsule Take 125 mg by mouth 3 (three) times daily. 11/04/20 11/05/21  [provider]  ibuprofen (ADVIL) 400 MG tablet Take 400 mg by mouth 2 (two) times daily as needed for moderate pain. 11/30/20   [provider]  losartan (COZAAR) 25 MG tablet Take 1 tablet by mouth daily. 07/13/20 07/14/21  [provider]  potassium chloride SA (KLOR-CON) 20 MEQ tablet Take 1 tablet by mouth daily. 07/09/20 07/10/21  [provider]  QUEtiapine (SEROQUEL) 100 MG tablet Take 0.5 tablets by mouth at bedtime. 10/29/20   [provider]       Allergies    Shellfish allergy and Iodine    Review of Systems   Review of Systems  Constitutional:  Negative for chills and fever.  HENT:  Positive for dental problem, facial swelling and sore throat. Negative for congestion, drooling, trouble swallowing and voice change.   Respiratory:  Negative for shortness of breath.   Cardiovascular:  Negative for chest pain.  Gastrointestinal:  Negative for nausea and vomiting.  All other systems reviewed and are negative.  Physical Exam Updated Vital Signs BP (!) 175/101    Pulse 82    Temp 99 F (37.2 C) (Oral)    Resp 18    Ht 6\' 2"  (1.88 m)    Wt 108.9 kg    SpO2 98%    BMI 30.81 kg/m  Physical Exam Vitals and nursing note reviewed.  Constitutional:      Appearance: Normal appearance.  HENT:     Head: Normocephalic and atraumatic.     Mouth/Throat:     Lips: Pink.     Mouth: Mucous membranes are moist.     Dentition: Dental caries present.     Comments: Significant left-sided maxillary and mandibular swelling.  Overall poor dentition, no obvious abscess.  However there is a area of erythema with questionable fluctuance over the left upper molar.  No submental swelling.  No drooling.  Patient is tolerating his own secretions. Eyes:     Conjunctiva/sclera: Conjunctivae normal.  Pulmonary:  Effort: Pulmonary effort is normal. No accessory muscle usage, respiratory distress or retractions.     Breath sounds: Normal air entry. No stridor or transmitted upper airway sounds.  Skin:    General: Skin is warm and dry.  Neurological:     Mental Status: He is alert.  Psychiatric:        Mood and Affect: Mood normal.        Behavior: Behavior normal.    ED Results / Procedures / Treatments   Labs (all labs ordered are listed, but only abnormal results are displayed) Labs Reviewed - No data to display  EKG None  Radiology No results found.  Procedures Procedures    Medications Ordered in ED Medications - No data to  display  ED Course/ Medical Decision Making/ A&P                           Medical Decision Making Patient is a 56 year old male with history of hypertension and stroke who presents the emergency department complaining of left-sided dental pain and facial swelling.  On exam, patient has significant left maxillary swelling with area of erythema and questionable fluctuance over the left upper molar.  No drooling, trismus, or voice change.  Patient is tolerating his own secretions.  No evidence of peritonsillar abscess or Ludwig's angina.   I do not believe patient is requiring admission or inpatient treatment for his symptoms.  He has been observed in the emergency department for most 4 hours, and is continued to be not hypoxic and have no respiratory distress.  He remains hemodynamically stable, and overall clinically well-appearing.  We will treat for likely dental abscess with outpatient antibiotics, [facility]  Sister who is his primary caretaker is agreeable to the plan.  Final Clinical Impression(s) / ED Diagnoses Final diagnoses:  Dental abscess    Rx / DC Orders ED Discharge Orders          Ordered    amoxicillin-clavulanate (AUGMENTIN) 875-125 MG tablet  Every 12 hours        10/08/21 1555           Portions of this report may have been transcribed using voice recognition software. Every effort was made to ensure accuracy; however, inadvertent computerized transcription errors may be present.    Abree Romick T, PA-C 10/08/21 1555    Franne Forts, DO 10/08/21 1934

## 2021-10-08 NOTE — ED Notes (Signed)
Patients sister reported that the patient did not take his blood pressure medication this morning

## 2021-10-08 NOTE — ED Notes (Signed)
Ice pack applied to (L) cheek

## 2021-10-08 NOTE — ED Triage Notes (Addendum)
C/o R side maxillary and mandibular dental pain and swelling, onset yesterday, worse today. No meds PTA. Not previously seen for the same. Believes it is r/t an abscessed tooth. Denies fever, NVD. [facility]

## 2021-12-07 IMAGING — CT CT HEAD W/O CM
4 series · 16 of 47 positions shown, 18 images · non-contrast
Comparison: Prior head CT 12/23/2020.

CLINICAL DATA: Neuro deficit, acute, stroke suspected. Additional
history provided: Blurred vision.

EXAM:
CT HEAD WITHOUT CONTRAST
TECHNIQUE: Contiguous axial images were obtained from the base of the skull
through the vertex without intravenous contrast.

[Series 2: head wo · axial · 0.48mm/px · z∈[-257,-122]mm · 7 of 37 slices shown, 9 images]
[im 5/37  brain]
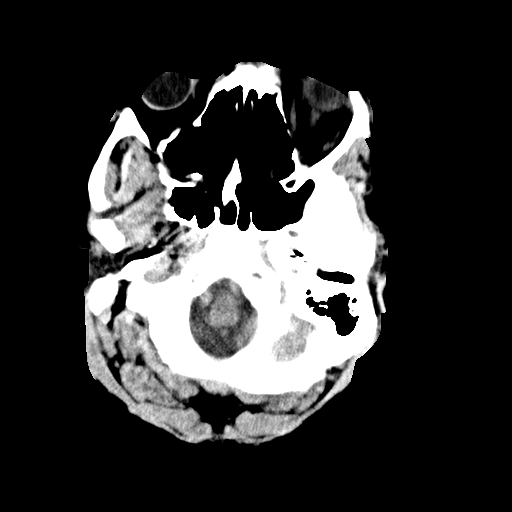
[im 5/37  bone]
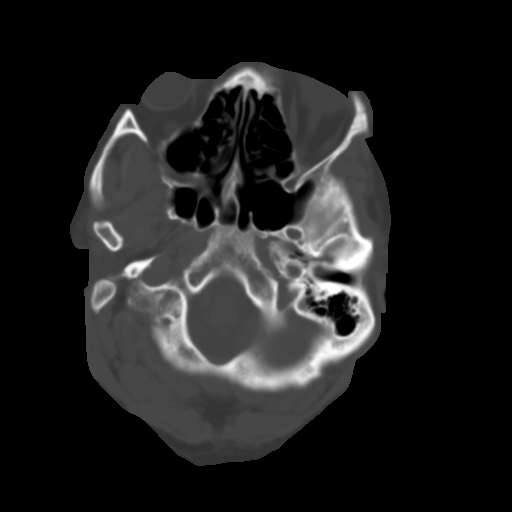
[im 10/37  brain]
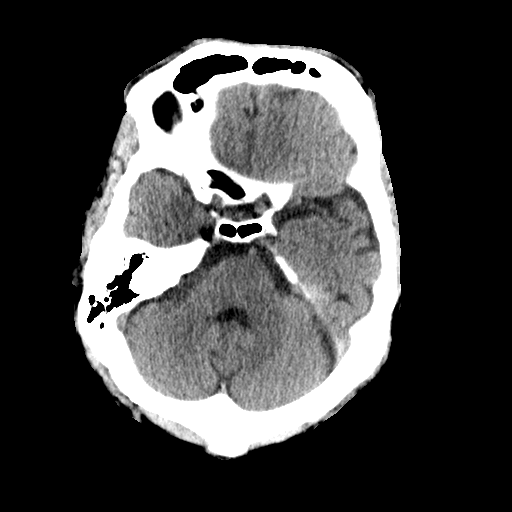
[im 14/37  brain]
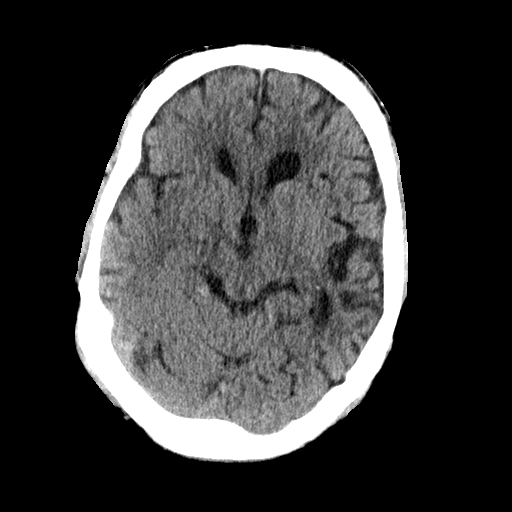
[im 19/37  brain]
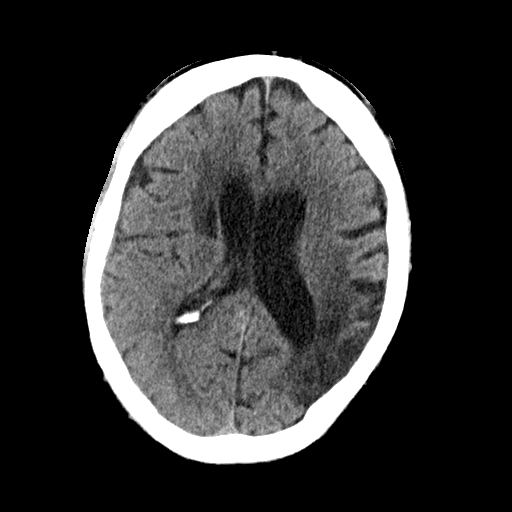
[im 23/37  brain]
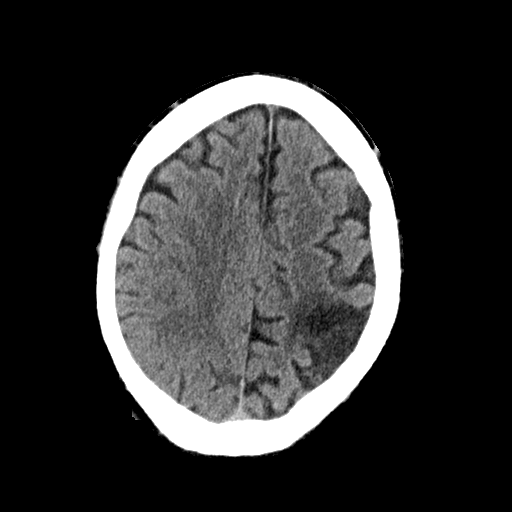
[im 23/37  bone]
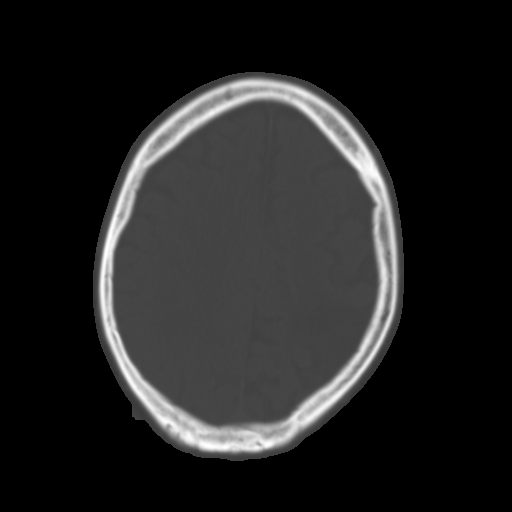
[im 28/37  brain]
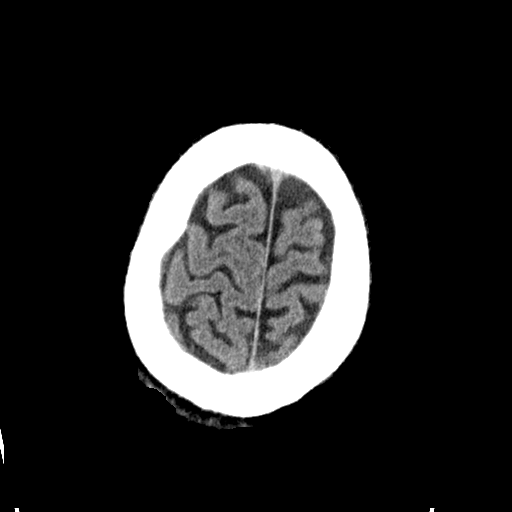
[im 32/37  brain]
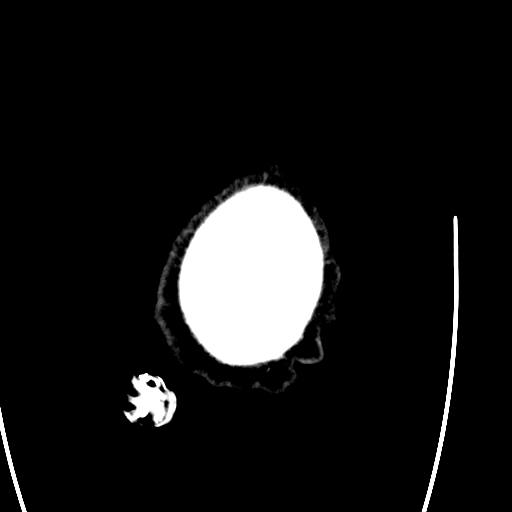

[Series 3: head bone · axial · 0.48mm/px · z∈[-259,-223]mm · 3 of 91 slices shown]
[im 10/91  bone]
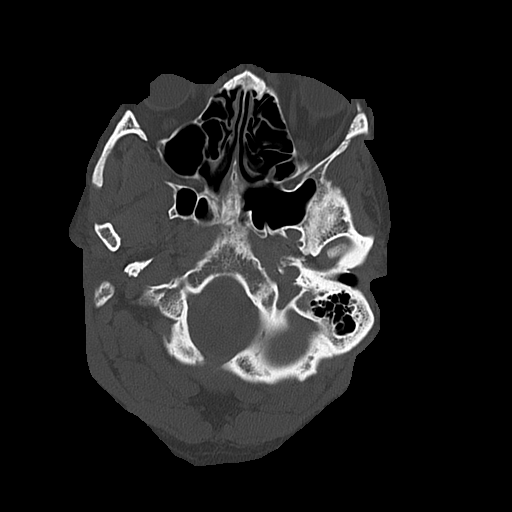
[im 19/91  bone]
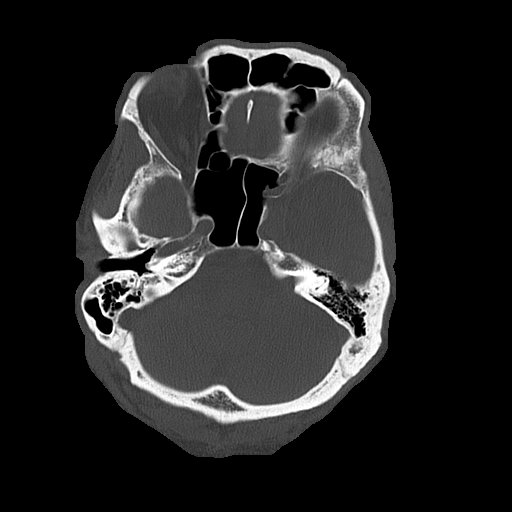
[im 28/91  bone]
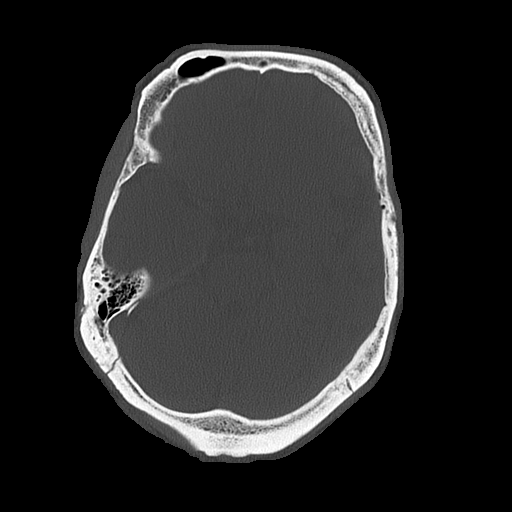

[Series 4: coronal soft · coronal · 0.37mm/px · 3 of 78 slices shown]
[im 26/78  brain]
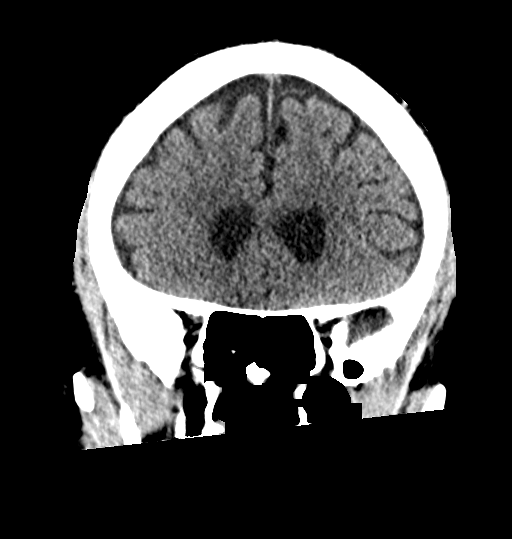
[im 35/78  brain]
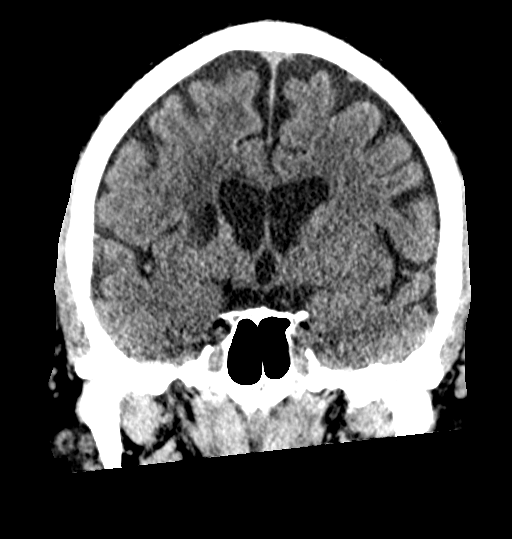
[im 43/78  brain]
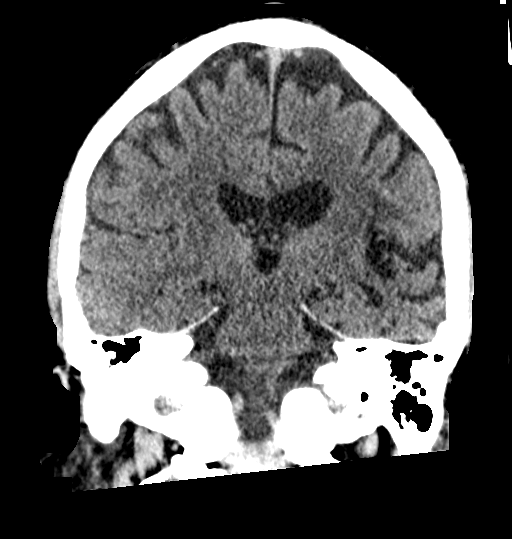

[Series 5: sagittal soft · sagittal · 0.39mm/px · 3 of 64 slices shown]
[im 23/64  brain]
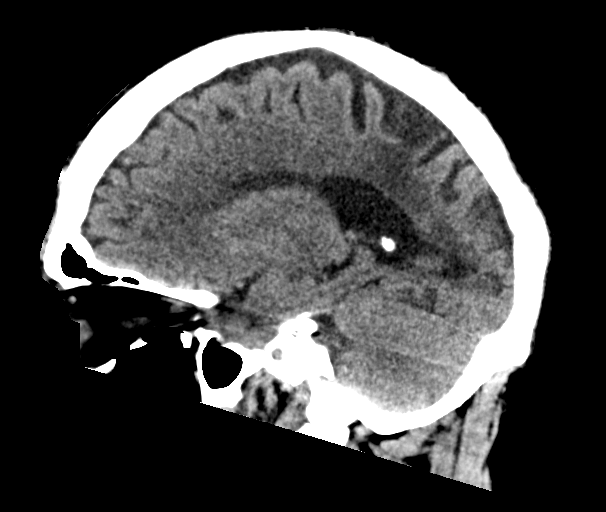
[im 32/64  brain]
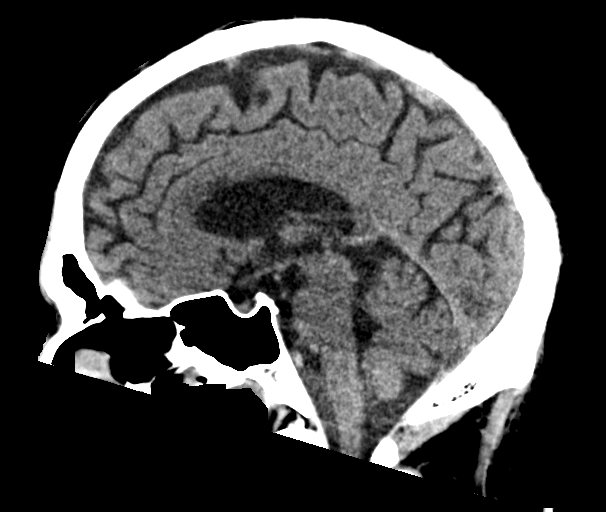
[im 42/64  brain]
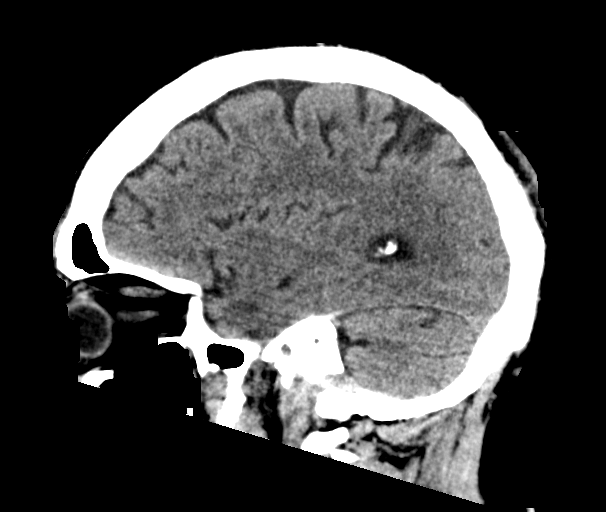

[16 of 47 positions shown; findings below may reference images not displayed]

FINDINGS: Brain:

Mild generalized parenchymal atrophy.

Redemonstrated chronic cortical/subcortical infarct within the left
parietal, temporal and occipital lobes. Ex vacuo dilatation of the
left lateral ventricle.

Redemonstrated chronic infarct within the right corona radiata,
basal ganglia and internal capsule.

Background mild ill-defined hypoattenuation within the cerebral
white matter, nonspecific but compatible with chronic small vessel
ischemic disease.

There is no acute intracranial hemorrhage.

No acute demarcated cortical infarct.

No extra-axial fluid collection.

No evidence of intracranial mass.

No midline shift.

Vascular: No hyperdense vessel.

Skull: Normal. Negative for fracture or focal lesion.

Sinuses/Orbits: Visualized orbits show no acute finding. Small
mucous retention cysts within the right frontal and left ethmoid
sinuses. Background trace scattered mucosal thickening within the
bilateral ethmoid sinuses. Trace left sphenoid sinus mucosal
thickening. Trace mucosal thickening within the bilateral maxillary
sinuses at the imaged levels.
IMPRESSION: No evidence of acute intracranial abnormality.

Redemonstrated chronic cortical/subcortical left MCA territory
infarct within the left parietal, temporal and occipital lobes.

Redemonstrated chronic infarct within the right corona radiata,
basal ganglia and internal capsule.

Stable background mild generalized parenchymal atrophy and cerebral
white matter chronic small vessel ischemic disease.

Paranasal sinus disease, as described.

## 2022-10-09 ENCOUNTER — Encounter (HOSPITAL_COMMUNITY): Payer: Self-pay

## 2022-10-09 ENCOUNTER — Observation Stay (HOSPITAL_COMMUNITY)
Admission: EM | Admit: 2022-10-09 | Discharge: 2022-10-10 | Disposition: A | Discharge status: Home / Self Care | Payer: No Typology Code available for payment source | Attending: Family Medicine | Admitting: Family Medicine

## 2022-10-09 ENCOUNTER — Other Ambulatory Visit: Payer: Self-pay

## 2022-10-09 DIAGNOSIS — F0150 Vascular dementia without behavioral disturbance: Secondary | ICD-10-CM | POA: Diagnosis not present

## 2022-10-09 DIAGNOSIS — E1165 Type 2 diabetes mellitus with hyperglycemia: Secondary | ICD-10-CM | POA: Diagnosis not present

## 2022-10-09 DIAGNOSIS — D72829 Elevated white blood cell count, unspecified: Secondary | ICD-10-CM | POA: Insufficient documentation

## 2022-10-09 DIAGNOSIS — E785 Hyperlipidemia, unspecified: Secondary | ICD-10-CM | POA: Insufficient documentation

## 2022-10-09 DIAGNOSIS — Z79899 Other long term (current) drug therapy: Secondary | ICD-10-CM | POA: Insufficient documentation

## 2022-10-09 DIAGNOSIS — K219 Gastro-esophageal reflux disease without esophagitis: Secondary | ICD-10-CM | POA: Insufficient documentation

## 2022-10-09 DIAGNOSIS — R739 Hyperglycemia, unspecified: Secondary | ICD-10-CM | POA: Diagnosis present

## 2022-10-09 DIAGNOSIS — E119 Type 2 diabetes mellitus without complications: Secondary | ICD-10-CM

## 2022-10-09 DIAGNOSIS — Z87891 Personal history of nicotine dependence: Secondary | ICD-10-CM | POA: Insufficient documentation

## 2022-10-09 DIAGNOSIS — I10 Essential (primary) hypertension: Secondary | ICD-10-CM | POA: Insufficient documentation

## 2022-10-09 DIAGNOSIS — E876 Hypokalemia: Secondary | ICD-10-CM | POA: Diagnosis not present

## 2022-10-09 DIAGNOSIS — F319 Bipolar disorder, unspecified: Secondary | ICD-10-CM | POA: Insufficient documentation

## 2022-10-09 DIAGNOSIS — Z8673 Personal history of transient ischemic attack (TIA), and cerebral infarction without residual deficits: Secondary | ICD-10-CM

## 2022-10-09 HISTORY — DX: Unspecified dementia, unspecified severity, without behavioral disturbance, psychotic disturbance, mood disturbance, and anxiety: F03.90

## 2022-10-09 LAB — CBC WITH DIFFERENTIAL/PLATELET
Abs Immature Granulocytes: 0.06 10*3/uL (ref 0.00–0.07)
Abs Immature Granulocytes: 0.06 10*3/uL (ref 0.00–0.07)
Basophils Absolute: 0.1 10*3/uL (ref 0.0–0.1)
Basophils Absolute: 0.1 10*3/uL (ref 0.0–0.1)
Basophils Relative: 0 %
Basophils Relative: 0 %
Eosinophils Absolute: 0.1 10*3/uL (ref 0.0–0.5)
Eosinophils Absolute: 0.1 10*3/uL (ref 0.0–0.5)
Eosinophils Relative: 0 %
Eosinophils Relative: 0 %
HCT: 47.5 % (ref 39.0–52.0)
HCT: 45.1 % (ref 39.0–52.0)
Hemoglobin: 15.3 g/dL (ref 13.0–17.0)
Hemoglobin: 14.7 g/dL (ref 13.0–17.0)
Immature Granulocytes: 0 %
Immature Granulocytes: 0 %
Lymphocytes Relative: 28 %
Lymphocytes Relative: 31 %
Lymphs Abs: 5.1 10*3/uL — ABNORMAL HIGH (ref 0.7–4.0)
Lymphs Abs: 5.1 10*3/uL — ABNORMAL HIGH (ref 0.7–4.0)
MCH: 29 pg (ref 26.0–34.0)
MCH: 29.5 pg (ref 26.0–34.0)
MCHC: 32.2 g/dL (ref 30.0–36.0)
MCHC: 32.6 g/dL (ref 30.0–36.0)
MCV: 90.1 fL (ref 80.0–100.0)
MCV: 90.6 fL (ref 80.0–100.0)
Monocytes Absolute: 1.1 10*3/uL — ABNORMAL HIGH (ref 0.1–1.0)
Monocytes Absolute: 1 10*3/uL (ref 0.1–1.0)
Monocytes Relative: 6 %
Monocytes Relative: 6 %
Neutro Abs: 11.9 10*3/uL — ABNORMAL HIGH (ref 1.7–7.7)
Neutro Abs: 10.4 10*3/uL — ABNORMAL HIGH (ref 1.7–7.7)
Neutrophils Relative %: 66 %
Neutrophils Relative %: 63 %
Platelets: 298 10*3/uL (ref 150–400)
Platelets: 277 10*3/uL (ref 150–400)
RBC: 5.27 MIL/uL (ref 4.22–5.81)
RBC: 4.98 MIL/uL (ref 4.22–5.81)
RDW: 11.8 % (ref 11.5–15.5)
RDW: 11.9 % (ref 11.5–15.5)
WBC: 18.3 10*3/uL — ABNORMAL HIGH (ref 4.0–10.5)
WBC: 16.7 10*3/uL — ABNORMAL HIGH (ref 4.0–10.5)
nRBC: 0 % (ref 0.0–0.2)
nRBC: 0 % (ref 0.0–0.2)

## 2022-10-09 LAB — URINALYSIS, ROUTINE W REFLEX MICROSCOPIC
Bacteria, UA: NONE SEEN
Bilirubin Urine: NEGATIVE
Glucose, UA: >=500 mg/dL — AB
Hgb urine dipstick: NEGATIVE
Ketones, ur: 20 mg/dL — AB
Leukocytes,Ua: NEGATIVE
Nitrite: NEGATIVE
Protein, ur: NEGATIVE mg/dL
Specific Gravity, Urine: 1.03 (ref 1.005–1.030)
pH: 5 (ref 5.0–8.0)

## 2022-10-09 LAB — GLUCOSE, CAPILLARY
Glucose-Capillary: 273 mg/dL — ABNORMAL HIGH (ref 70–99)
Glucose-Capillary: 205 mg/dL — ABNORMAL HIGH (ref 70–99)
Glucose-Capillary: 197 mg/dL — ABNORMAL HIGH (ref 70–99)
Glucose-Capillary: 200 mg/dL — ABNORMAL HIGH (ref 70–99)

## 2022-10-09 LAB — BLOOD GAS, VENOUS
Acid-Base Excess: 9 mmol/L — ABNORMAL HIGH (ref 0.0–2.0)
Bicarbonate: 35.9 mmol/L — ABNORMAL HIGH (ref 20.0–28.0)
O2 Saturation: 27.8 %
Patient temperature: 36.1
pCO2, Ven: 56 mmHg (ref 44–60)
pH, Ven: 7.41 (ref 7.25–7.43)
pO2, Ven: <31 mmHg — CL (ref 32–45)

## 2022-10-09 LAB — BASIC METABOLIC PANEL
Anion gap: 15 (ref 5–15)
BUN: 13 mg/dL (ref 6–20)
CO2: 30 mmol/L (ref 22–32)
Calcium: 9.6 mg/dL (ref 8.9–10.3)
Chloride: 87 mmol/L — ABNORMAL LOW (ref 98–111)
Creatinine, Ser: 1.18 mg/dL (ref 0.61–1.24)
GFR, Estimated: >60 mL/min (ref >60)
Glucose, Bld: 601 mg/dL (ref 70–99)
Potassium: 3.4 mmol/L — ABNORMAL LOW (ref 3.5–5.1)
Sodium: 132 mmol/L — ABNORMAL LOW (ref 135–145)

## 2022-10-09 LAB — CBG MONITORING, ED
Glucose-Capillary: >600 mg/dL (ref 70–99)
Glucose-Capillary: 512 mg/dL (ref 70–99)
Glucose-Capillary: 352 mg/dL — ABNORMAL HIGH (ref 70–99)

## 2022-10-09 LAB — MRSA NEXT GEN BY PCR, NASAL: MRSA by PCR Next Gen: NOT DETECTED

## 2022-10-09 LAB — BETA-HYDROXYBUTYRIC ACID: Beta-Hydroxybutyric Acid: 1.88 mmol/L — ABNORMAL HIGH (ref 0.05–0.27)

## 2022-10-09 MED ORDER — LACTATED RINGERS IV SOLN: INTRAVENOUS | Status: DC

## 2022-10-09 MED ORDER — ORAL CARE MOUTH RINSE: 15.0000 mL | OROMUCOSAL | Status: DC | PRN

## 2022-10-09 MED ORDER — INSULIN REGULAR(HUMAN) IN NACL 100-0.9 UT/100ML-% IV SOLN
INTRAVENOUS | Status: DC
  Administered 2022-10-09: 15 [IU]/h via INTRAVENOUS

## 2022-10-09 MED ORDER — DEXTROSE IN LACTATED RINGERS 5 % IV SOLN: INTRAVENOUS | Status: DC

## 2022-10-09 MED ORDER — METOPROLOL TARTRATE 5 MG/5ML IV SOLN: 5.0000 mg | Freq: Four times a day (QID) | INTRAVENOUS | Status: DC | PRN

## 2022-10-09 MED ORDER — POTASSIUM CHLORIDE CRYS ER 20 MEQ PO TBCR
40.0000 meq | EXTENDED_RELEASE_TABLET | Freq: Every day | ORAL | Status: DC
  Administered 2022-10-09 – 2022-10-10 (×2): 40 meq via ORAL
  Filled 2022-10-09 (×2): qty 2

## 2022-10-09 MED ORDER — DEXTROSE 50 % IV SOLN: 0.0000 mL | INTRAVENOUS | Status: DC | PRN

## 2022-10-09 MED ORDER — LACTATED RINGERS IV BOLUS
1000.0000 mL | Freq: Once | INTRAVENOUS | Status: AC
  Administered 2022-10-09: 1000 mL via INTRAVENOUS

## 2022-10-09 MED ORDER — CHLORHEXIDINE GLUCONATE CLOTH 2 % EX PADS
6.0000 | MEDICATED_PAD | Freq: Every day | CUTANEOUS | Status: DC
  Administered 2022-10-09: 6 via TOPICAL

## 2022-10-09 NOTE — ED Triage Notes (Signed)
Patient was having excessive thirst, frequency urination x 2 day. Patient went to the [place] today and patient was instructed to come to the ED for a glucose > 500.  CBG in triage read HIGH.

## 2022-10-09 NOTE — ED Notes (Signed)
PCCM at BS ?

## 2022-10-09 NOTE — ED Provider Notes (Signed)
[facility] DEPT Provider Note   CSN: [id number] Arrival date & time: 10/09/22  1424     History  Chief Complaint  Patient presents with   Emesis   Hyperglycemia   Polydipsia   Urinary Frequency    Patrick Villarreal is a 57 y.o. male.  HPI 5 caveat secondary to cognitive impairment 57 year old male presents today with new onset excessive thirst, frequent urination, some associated vomiting, and new onset of diabetes. History is given by sister who is caregiver.  She states the patient has had the above symptoms for several weeks.  She took him to his [place] doctor today and he was noted to have hyperglycemia.  He has no prior history of diabetes.  He does have a history of stroke and cognitive impairment secondary to his stroke.     Home Medications Prior to Admission medications   Medication Sig Start Date End Date Taking? Authorizing Provider  amoxicillin-clavulanate (AUGMENTIN) 875-125 MG tablet Take 1 tablet by mouth every 12 (twelve) hours. 10/08/21   Roemhildt, Lorin T, PA-C  atorvastatin (LIPITOR) 80 MG tablet Take 80 mg by mouth every evening. 07/13/20 07/14/21  [provider]  cephALEXin (KEFLEX) 500 MG capsule Take 1 capsule (500 mg total) by mouth 3 (three) times daily. 05/19/21   Larene Pickett, PA-C  divalproex (DEPAKOTE SPRINKLE) 125 MG capsule Take 125 mg by mouth 3 (three) times daily. 11/04/20 11/05/21  [provider]  ibuprofen (ADVIL) 400 MG tablet Take 400 mg by mouth 2 (two) times daily as needed for moderate pain. 11/30/20   [provider]  losartan (COZAAR) 25 MG tablet Take 1 tablet by mouth daily. 07/13/20 07/14/21  [provider]  potassium chloride SA (KLOR-CON) 20 MEQ tablet Take 1 tablet by mouth daily. 07/09/20 07/10/21  [provider]  QUEtiapine (SEROQUEL) 100 MG tablet Take 0.5 tablets by mouth at bedtime. 10/29/20   [provider]      Allergies    Shellfish allergy and  Iodine    Review of Systems   Review of Systems  Physical Exam Updated Vital Signs BP (!) 161/97   Pulse 75   Temp 98.7 F (37.1 C) (Oral)   Resp 13   Ht 1.829 m (6')   Wt 111.1 kg   SpO2 97%   BMI 33.23 kg/m  Physical Exam Vitals and nursing note reviewed.  Constitutional:      General: He is not in acute distress.    Appearance: He is not ill-appearing.  HENT:     Head: Normocephalic.     Right Ear: External ear normal.     Left Ear: External ear normal.     Nose: Nose normal.     Mouth/Throat:     Mouth: Mucous membranes are dry.  Eyes:     Pupils: Pupils are equal, round, and reactive to light.  Cardiovascular:     Rate and Rhythm: Normal rate and regular rhythm.     Pulses: Normal pulses.  Pulmonary:     Effort: Pulmonary effort is normal.  Abdominal:     General: Bowel sounds are normal.     Palpations: Abdomen is soft.  Musculoskeletal:        General: Normal range of motion.     Cervical back: Normal range of motion.  Skin:    General: Skin is warm.     Capillary Refill: Capillary refill takes less than 2 seconds.  Neurological:     General: No  focal deficit present.     Mental Status: He is alert.  Psychiatric:        Mood and Affect: Mood normal.     ED Results / Procedures / Treatments   Labs (all labs ordered are listed, but only abnormal results are displayed) Labs Reviewed  BASIC METABOLIC PANEL - Abnormal; Notable for the following components:      Result Value   Sodium 132 (*)    Potassium 3.4 (*)    Chloride 87 (*)    Glucose, Bld 601 (*)    All other components within normal limits  CBC WITH DIFFERENTIAL/PLATELET - Abnormal; Notable for the following components:   WBC 18.3 (*)    Neutro Abs 11.9 (*)    Lymphs Abs 5.1 (*)    Monocytes Absolute 1.1 (*)    All other components within normal limits  BETA-HYDROXYBUTYRIC ACID - Abnormal; Notable for the following components:   Beta-Hydroxybutyric Acid 1.88 (*)    All other components  within normal limits  BLOOD GAS, VENOUS - Abnormal; Notable for the following components:   pO2, Ven <31 (*)    Bicarbonate 35.9 (*)    Acid-Base Excess 9.0 (*)    All other components within normal limits  URINALYSIS, ROUTINE W REFLEX MICROSCOPIC - Abnormal; Notable for the following components:   Color, Urine STRAW (*)    Glucose, UA >=500 (*)    Ketones, ur 20 (*)    All other components within normal limits  CBG MONITORING, ED - Abnormal; Notable for the following components:   Glucose-Capillary >600 (*)    All other components within normal limits  CBC WITH DIFFERENTIAL/PLATELET  CBG MONITORING, ED    EKG None  Radiology No results found.  Procedures .Critical Care  Performed by: Pattricia Boss, MD Authorized by: Pattricia Boss, MD   Critical care provider statement:    Critical care time (minutes):  30   Critical care end time:  10/09/2022 4:35 PM   Critical care was necessary to treat or prevent imminent or life-threatening deterioration of the following conditions:  Endocrine crisis   Critical care was time spent personally by me on the following activities:  Development of treatment plan with patient or surrogate, discussions with consultants, evaluation of patient's response to treatment, examination of patient, ordering and review of laboratory studies, ordering and review of radiographic studies, ordering and performing treatments and interventions, pulse oximetry, re-evaluation of patient's condition and review of old charts     Medications Ordered in ED Medications  insulin regular, human (MYXREDLIN) 100 units/ 100 mL infusion (has no administration in time range)  lactated ringers infusion (has no administration in time range)  dextrose 5 % in lactated ringers infusion (has no administration in time range)  dextrose 50 % solution 0-50 mL (has no administration in time range)  lactated ringers bolus 1,000 mL (1,000 mLs Intravenous New Bag/Given 10/09/22 1538)     ED Course/ Medical Decision Making/ A&P Clinical Course as of 10/09/22 1635  Mon Oct 09, 2022  [id number] Basic metabolic panel reviewed interpreted significant for elevated glucose at 601 Relative hyponatremia at 132 VBG pH is 7.41 Beta hydroxy uric acid mildly elevated at 1.8 [DR]    Clinical Course User Index [DR] Pattricia Boss, MD                           Medical Decision Making Amount and/or Complexity of Data Reviewed Labs: ordered. Decision-making details  documented in ED Course.  Risk Prescription drug management.  57 year old male presents today with new onset diabetes Sister gives history due to cognitive impairment Patient has had polyuria and polydipsia for several weeks.  Seen today at [facility] noted to be hyperglycemic Seen here in ED and blood sugar is elevated at greater than 600. pH is normal but does have some mildly elevated hydroxybutyric acid Patient is being treated with IV fluids and insulin drip Care was discussed with Dr. Ronaldo Miyamoto who will see for admission.         Final Clinical Impression(s) / ED Diagnoses Final diagnoses:  Diabetes mellitus, new onset [facility])    Rx / DC Orders ED Discharge Orders     None         Margarita Grizzle, MD 10/09/22 1635

## 2022-10-09 NOTE — ED Notes (Signed)
Pt alert, NAD, calm, interactive, resps e/u, speaking in clear complete sentences. VSS. RR normal. Denies abd pain. States, "sent by [facility] today". Labs done at [facility], paper results in hand. Wife at [facility]. C/o increased thirst and urination. Previously told "was borderline diabetic". "No h/o really high BS numbers".

## 2022-10-09 NOTE — H&P (Signed)
History and Physical    Patient: Patrick Villarreal GEZ:[id number] DOB: 09-24-1966 DOA: 10/09/2022 DOS: the patient was seen and examined on 10/09/2022 [facility]  Patient coming from: Home  Chief Complaint:  Chief Complaint  Patient presents with   Emesis   Hyperglycemia   Polydipsia   Urinary Frequency   HPI: Patrick Villarreal is a 57 y.o. male with medical history significant of CVA, HTN, HLD, Bipolar, GERD, vascular dementia. Presenting with polydipsia and polyuria. History is from sister at bedside. She reports that she has seen his fluid intake increase over the last 2 weeks. He has had increased urination as well. He hasn't had any dysuria or fevers. He has had a few more episodes of urinary incontinence during this time. She became concerned and took him to his PCP. He was noted to have a high glucose. So he was directed to the ED for further evaluation.   Review of Systems: As mentioned in the history of present illness. All other systems reviewed and are negative. Past Medical History:  Diagnosis Date   Dementia (HCC)    Hypertension    Stroke (HCC)    Substance abuse (HCC)    Past Surgical History:  Procedure Laterality Date   APPENDECTOMY     Social History:  reports that he has quit smoking. He has never used smokeless tobacco. He reports that he does not currently use alcohol. He reports that he does not currently use drugs.  Allergies  Allergen Reactions   Shellfish Allergy Anaphylaxis   Iodine Rash    History reviewed. No pertinent family history.  Prior to Admission medications   Medication Sig Start Date End Date Taking? Authorizing Provider  amoxicillin-clavulanate (AUGMENTIN) 875-125 MG tablet Take 1 tablet by mouth every 12 (twelve) hours. 10/08/21   Roemhildt, Lorin T, PA-C  atorvastatin (LIPITOR) 80 MG tablet Take 80 mg by mouth every evening. 07/13/20 07/14/21  [provider]  cephALEXin (KEFLEX) 500 MG capsule Take 1 capsule  (500 mg total) by mouth 3 (three) times daily. 05/19/21   Garlon Hatchet, PA-C  divalproex (DEPAKOTE SPRINKLE) 125 MG capsule Take 125 mg by mouth 3 (three) times daily. 11/04/20 11/05/21  [provider]  ibuprofen (ADVIL) 400 MG tablet Take 400 mg by mouth 2 (two) times daily as needed for moderate pain. 11/30/20   [provider]  losartan (COZAAR) 25 MG tablet Take 1 tablet by mouth daily. 07/13/20 07/14/21  [provider]  potassium chloride SA (KLOR-CON) 20 MEQ tablet Take 1 tablet by mouth daily. 07/09/20 07/10/21  [provider]  QUEtiapine (SEROQUEL) 100 MG tablet Take 0.5 tablets by mouth at bedtime. 10/29/20   [provider]    Physical Exam: Vitals:   10/09/22 1500 10/09/22 1501 10/09/22 1600  BP: (!) 140/117  (!) 161/97  Pulse: 89  75  Resp: 16  13  Temp: 98.7 F (37.1 C)    TempSrc: Oral    SpO2: 100%  97%  Weight:  111.1 kg   Height:  6' (1.829 m)    General: 57 y.o. male resting in bed in NAD Eyes: PERRL, normal sclera ENMT: Nares patent w/o discharge, orophaynx clear, dentition normal, ears w/o discharge/lesions/ulcers Neck: Supple, trachea midline Cardiovascular: RRR, +S1, S2, no m/g/r, equal pulses throughout Respiratory: CTABL, no w/r/r, normal WOB GI: BS+, NDNT, no masses noted, no organomegaly noted MSK: No e/c/c Skin: No rashes, bruises, ulcerations noted Neuro: A&O x name only, no focal deficits Psyc: Appropriate  interaction and affect, calm/cooperative  Data Reviewed:  Results for orders placed or performed during the hospital encounter of 10/09/22 (from the past 24 hour(s))  CBG monitoring, ED     Status: Abnormal   Collection Time: 10/09/22  2:53 PM  Result Value Ref Range   Glucose-Capillary >600 (HH) 70 - 99 mg/dL  Basic metabolic panel     Status: Abnormal   Collection Time: 10/09/22  3:20 PM  Result Value Ref Range   Sodium 132 (L) 135 - 145 mmol/L   Potassium 3.4 (L) 3.5 - 5.1 mmol/L   Chloride 87 (L)  98 - 111 mmol/L   CO2 30 22 - 32 mmol/L   Glucose, Bld 601 (HH) 70 - 99 mg/dL   BUN 13 6 - 20 mg/dL   Creatinine, Ser 1.18 0.61 - 1.24 mg/dL   Calcium 9.6 8.9 - 10.3 mg/dL   GFR, Estimated >60 >60 mL/min   Anion gap 15 5 - 15  CBC with Differential     Status: Abnormal   Collection Time: 10/09/22  3:20 PM  Result Value Ref Range   WBC 18.3 (H) 4.0 - 10.5 K/uL   RBC 5.27 4.22 - 5.81 MIL/uL   Hemoglobin 15.3 13.0 - 17.0 g/dL   HCT 47.5 39.0 - 52.0 %   MCV 90.1 80.0 - 100.0 fL   MCH 29.0 26.0 - 34.0 pg   MCHC 32.2 30.0 - 36.0 g/dL   RDW 11.8 11.5 - 15.5 %   Platelets 298 150 - 400 K/uL   nRBC 0.0 0.0 - 0.2 %   Neutrophils Relative % 66 %   Neutro Abs 11.9 (H) 1.7 - 7.7 K/uL   Lymphocytes Relative 28 %   Lymphs Abs 5.1 (H) 0.7 - 4.0 K/uL   Monocytes Relative 6 %   Monocytes Absolute 1.1 (H) 0.1 - 1.0 K/uL   Eosinophils Relative 0 %   Eosinophils Absolute 0.1 0.0 - 0.5 K/uL   Basophils Relative 0 %   Basophils Absolute 0.1 0.0 - 0.1 K/uL   Immature Granulocytes 0 %   Abs Immature Granulocytes 0.06 0.00 - 0.07 K/uL  Beta-hydroxybutyric acid     Status: Abnormal   Collection Time: 10/09/22  3:20 PM  Result Value Ref Range   Beta-Hydroxybutyric Acid 1.88 (H) 0.05 - 0.27 mmol/L  Blood gas, venous (at [facility] and [facility])     Status: Abnormal   Collection Time: 10/09/22  3:20 PM  Result Value Ref Range   pH, Ven 7.41 7.25 - 7.43   pCO2, Ven 56 44 - 60 mmHg   pO2, Ven <31 (LL) 32 - 45 mmHg   Bicarbonate 35.9 (H) 20.0 - 28.0 mmol/L   Acid-Base Excess 9.0 (H) 0.0 - 2.0 mmol/L   O2 Saturation 27.8 %   Patient temperature 36.1   Urinalysis, Routine w reflex microscopic     Status: Abnormal   Collection Time: 10/09/22  3:20 PM  Result Value Ref Range   Color, Urine STRAW (A) YELLOW   APPearance CLEAR CLEAR   Specific Gravity, Urine 1.030 1.005 - 1.030   pH 5.0 5.0 - 8.0   Glucose, UA >=500 (A) NEGATIVE mg/dL   Hgb urine dipstick NEGATIVE NEGATIVE   Bilirubin Urine NEGATIVE NEGATIVE    Ketones, ur 20 (A) NEGATIVE mg/dL   Protein, ur NEGATIVE NEGATIVE mg/dL   Nitrite NEGATIVE NEGATIVE   Leukocytes,Ua NEGATIVE NEGATIVE   RBC / HPF 0-5 0 - 5 RBC/hpf   WBC, UA 0-5 0 - 5  WBC/hpf   Bacteria, UA NONE SEEN NONE SEEN   Squamous Epithelial / HPF 0-5 0 - 5 /HPF   Assessment and Plan: DM2     - admit to obs, SDU     - Endotool for hyperglycemia     - DM coordinator     - A1c  HTN     - continue home regimen when confirmed     - PRN metoprolol  HLD     - continue home regimen when confirmed  GERD     - continue home regimen when confirmed  Bipolar d/o Vascular dementia     - continue home regimen when confirmed  Hx of CVA     - continue home regimen when confirmed  Hypokalemia     - replace K+; check Mg2+  Leukocytosis     - UA negative, no fevers     - reactive?     - trend for now  Advance Care Planning:   Code Status: DNI  Consults: None  Family Communication: w/ sister at bedside  Severity of Illness: The appropriate patient status for this patient is OBSERVATION. Observation status is judged to be reasonable and necessary in order to provide the required intensity of service to ensure the patient's safety. The patient's presenting symptoms, physical exam findings, and initial radiographic and laboratory data in the context of their medical condition is felt to place them at decreased risk for further clinical deterioration. Furthermore, it is anticipated that the patient will be medically stable for discharge from the hospital within 2 midnights of admission.   Author: Teddy Spike, DO 10/09/2022 5:01 PM  For on call review [email or web address].

## 2022-10-10 ENCOUNTER — Other Ambulatory Visit (HOSPITAL_COMMUNITY): Payer: Self-pay

## 2022-10-10 DIAGNOSIS — E1165 Type 2 diabetes mellitus with hyperglycemia: Secondary | ICD-10-CM | POA: Diagnosis not present

## 2022-10-10 LAB — BASIC METABOLIC PANEL
Anion gap: 8 (ref 5–15)
BUN: 10 mg/dL (ref 6–20)
CO2: 29 mmol/L (ref 22–32)
Calcium: 9 mg/dL (ref 8.9–10.3)
Chloride: 101 mmol/L (ref 98–111)
Creatinine, Ser: 0.77 mg/dL (ref 0.61–1.24)
GFR, Estimated: >60 mL/min (ref >60)
Glucose, Bld: 157 mg/dL — ABNORMAL HIGH (ref 70–99)
Potassium: 2.8 mmol/L — ABNORMAL LOW (ref 3.5–5.1)
Sodium: 138 mmol/L (ref 135–145)

## 2022-10-10 LAB — GLUCOSE, CAPILLARY
Glucose-Capillary: 135 mg/dL — ABNORMAL HIGH (ref 70–99)
Glucose-Capillary: 149 mg/dL — ABNORMAL HIGH (ref 70–99)
Glucose-Capillary: 151 mg/dL — ABNORMAL HIGH (ref 70–99)
Glucose-Capillary: 161 mg/dL — ABNORMAL HIGH (ref 70–99)
Glucose-Capillary: 166 mg/dL — ABNORMAL HIGH (ref 70–99)
Glucose-Capillary: 169 mg/dL — ABNORMAL HIGH (ref 70–99)
Glucose-Capillary: 225 mg/dL — ABNORMAL HIGH (ref 70–99)

## 2022-10-10 LAB — HEMOGLOBIN A1C
Hgb A1c MFr Bld: 12.2 % — ABNORMAL HIGH (ref 4.8–5.6)
Mean Plasma Glucose: 303 mg/dL

## 2022-10-10 LAB — HIV ANTIBODY (ROUTINE TESTING W REFLEX): HIV Screen 4th Generation wRfx: NONREACTIVE

## 2022-10-10 MED ORDER — SODIUM CHLORIDE 0.9% FLUSH: 3.0000 mL | INTRAVENOUS | Status: DC | PRN

## 2022-10-10 MED ORDER — QUETIAPINE FUMARATE 25 MG PO TABS
25.0000 mg | ORAL_TABLET | Freq: Every day | ORAL | 0 refills | Status: AC
  Filled 2022-10-10: qty 30, 30d supply, fill #0

## 2022-10-10 MED ORDER — "PEN NEEDLES 3/16"" 31G X 5 MM MISC"
0 refills | Status: AC
  Filled 2022-10-10: qty 100, 100d supply, fill #0
  Filled 2022-10-10: qty 100, 90d supply, fill #0

## 2022-10-10 MED ORDER — BLOOD GLUCOSE METER KIT
PACK | 0 refills | Status: AC
  Filled 2022-10-10: qty 1, fill #0

## 2022-10-10 MED ORDER — POTASSIUM CHLORIDE CRYS ER 20 MEQ PO TBCR
40.0000 meq | EXTENDED_RELEASE_TABLET | Freq: Once | ORAL | Status: AC
  Administered 2022-10-10: 40 meq via ORAL
  Filled 2022-10-10: qty 2

## 2022-10-10 MED ORDER — METFORMIN HCL 500 MG PO TABS
ORAL_TABLET | ORAL | 0 refills | Status: AC
  Filled 2022-10-10: qty 90, 90d supply, fill #0

## 2022-10-10 MED ORDER — SODIUM CHLORIDE 0.9 % IV SOLN: 250.0000 mL | INTRAVENOUS | Status: DC | PRN

## 2022-10-10 MED ORDER — INSULIN GLARGINE 100 UNIT/ML SOLOSTAR PEN
10.0000 [IU] | PEN_INJECTOR | Freq: Every day | SUBCUTANEOUS | 11 refills | Status: AC
  Filled 2022-10-10: qty 3, 30d supply, fill #0

## 2022-10-10 MED ORDER — INSULIN GLARGINE-YFGN 100 UNIT/ML ~~LOC~~ SOLN
5.0000 [IU] | Freq: Once | SUBCUTANEOUS | Status: AC
  Administered 2022-10-10: 5 [IU] via SUBCUTANEOUS
  Filled 2022-10-10: qty 0.05

## 2022-10-10 MED ORDER — SODIUM CHLORIDE 0.9% FLUSH
3.0000 mL | Freq: Two times a day (BID) | INTRAVENOUS | Status: DC
  Administered 2022-10-10: 3 mL via INTRAVENOUS

## 2022-10-10 MED ORDER — INSULIN ASPART 100 UNIT/ML IJ SOLN
0.0000 [IU] | Freq: Three times a day (TID) | INTRAMUSCULAR | Status: DC
  Administered 2022-10-10: 3 [IU] via SUBCUTANEOUS
  Administered 2022-10-10: 5 [IU] via SUBCUTANEOUS
  Administered 2022-10-10: 2 [IU] via SUBCUTANEOUS

## 2022-10-10 MED ORDER — INSULIN STARTER KIT- PEN NEEDLES (ENGLISH)
1.0000 | Freq: Once | Status: AC
  Administered 2022-10-10: 1
  Filled 2022-10-10: qty 1

## 2022-10-10 MED ORDER — POTASSIUM CHLORIDE 10 MEQ/100ML IV SOLN
10.0000 meq | INTRAVENOUS | Status: AC
  Administered 2022-10-10 (×2): 10 meq via INTRAVENOUS
  Filled 2022-10-10 (×2): qty 100

## 2022-10-10 MED ORDER — INSULIN ASPART 100 UNIT/ML IJ SOLN: 0.0000 [IU] | Freq: Every day | INTRAMUSCULAR | Status: DC

## 2022-10-10 MED ORDER — LIVING WELL WITH DIABETES BOOK
Freq: Once | Status: AC
  Filled 2022-10-10: qty 1

## 2022-10-10 NOTE — Inpatient Diabetes Management (Signed)
Inpatient Diabetes Program Recommendations  AACE/ADA: New Consensus Statement on Inpatient Glycemic Control (2015)  Target Ranges:  Prepandial:   less than 140 mg/dL      Peak postprandial:   less than 180 mg/dL (1-2 hours)      Critically ill patients:  140 - 180 mg/dL   Lab Results  Component Value Date   GLUCAP 135 (H) 10/10/2022   HGBA1C 12.2 (H) 10/09/2022    Review of Glycemic Control  Diabetes history: Prediabetes Outpatient Diabetes medications: None Current orders for Inpatient glycemic control: LSemglee 5 units QD, Novolog 0-15 TID with meals and 0-5 HS  HgbA1C - 12.2%  Inpatient Diabetes Program Recommendations:    For discharge:  Lantus/Levemir 20 units QD Metformin 500 mg BID  Will need Blood glucose meter kit (#[id number])  Insulin pen needles (#[id number])  [facility] two. Take meter/logbook to appt for MD to review blood sugars.  Spoke with patient and sisterabout new diabetes diagnosis.  Discussed A1C results (12.2%) and explained what an A1C is and informed patient that his current A1C indicates an average glucose of 300 mg/dl over the past 2-3 months. Discussed basic pathophysiology of DM Type 2, basic home care, importance of checking CBGs and maintaining good CBG control to prevent long-term and short-term complications. Reviewed glucose and A1C goals.Reviewed signs and symptoms of hyperglycemia and hypoglycemia along with treatment for both. Discussed impact of nutrition, exercise, stress, sickness, and medications on diabetes control. Pt will change sodas to diet and leave off juAsked sister to monitor blood sugars 3x/day before meals and explained how MD willl use logbook to make insulin adjustments if needed. Educated patient and spouse on insulin pen use at home. Reviewed contents of insulin flexpen starter kit. Reviewed all steps if insulin pen including attachment of needle, 2-unit air shot, dialing up dose, giving injection, removing  needle, disposal of sharps, storage of unused insulin, disposal of insulin etc. Patient able to provide successful return demonstration. Also reviewed troubleshooting with insulin pen. MD to give patient Rxs for insulin pens and insulin pen needles. Answered all questions.  Thank you. Lorenda Peck, RD, LDN, [place] Inpatient Diabetes Coordinator [phone]

## 2022-10-10 NOTE — TOC Progression Note (Signed)
Transition of Care [facility]) - Progression Note    Patient Details  Name: Patrick Villarreal MRN: [id number] Date of Birth: [birth year]  Transition of [facility]) CM/SW Contact  Purcell Mouton, RN Phone Number: 10/10/2022, 10:32 AM  Clinical Narrative:    Pt is A Veteran with [facility] from the the [place]. [facility]. Spoke with pt's sister Thaison Kolodziejski who was at pt's bedside. Pt live with [facility] and is active with the [place] in [place], [place],  Dr. Ishmael Holter is PCP.    Expected Discharge Plan: Home/Self Care Barriers to Discharge: No Barriers Identified  Expected Discharge Plan and Services     Post Acute Care Choice: NA Living arrangements for the past 2 months: Single Family Home                                       Social Determinants of Health (SDOH) Interventions SDOH Screenings   Food Insecurity: No Food Insecurity (10/09/2022)  Housing: Low Risk  (10/09/2022)  Transportation Needs: No Transportation Needs (10/09/2022)  Utilities: Not At Risk (10/09/2022)  Tobacco Use: Medium Risk (10/09/2022)    Readmission Risk Interventions     No data to display

## 2022-10-10 NOTE — Discharge Summary (Signed)
Physician Discharge Summary   Patient: Patrick Villarreal MRN: [id number] DOB: Aug 30, 1966  Admit date:     10/09/2022  Discharge date: 10/10/22  Discharge Physician: Patrick Villarreal   PCP: [facility]     Recommendations at discharge:  Follow up with PCP at the [facility] for new diabetes PCP: Please taper off or monitor Seroquel as appropriate, given new diabetes PCP: Please repeat potassium in 1 week     Discharge Diagnoses: Principal Problem:   Hyperglycemia due to diabetes mellitus ([employer]) Active Problems:   DM2 (diabetes mellitus, type 2) ([place])   HTN (hypertension)   HLD (hyperlipidemia)   GERD (gastroesophageal reflux disease)   Hypokalemia   Bipolar disorder ([place])   Vascular dementia ([place])   History of CVA (cerebrovascular accident)        Hospital Course: [name] Is a 57 y.o. M with dementia, lives at home, obesity, HTN, Bipolar d/o, hx CVA who presented with polyuria and polydipsia for few weeks.  Went to PCP who found he had hyperglycemia.      Type 2 diabetes with hyperglycemic crisis In the ER, glucose 601 mg/dL, no acidosis, Admitted on insulin drip.  Glucoses controlled easily overnight.  Transitioned to subcutaneous insulin and glucoses controlled.  Education by diabetes coordinator accomplished, patient discharged with glucometer, supplies, new metformin and Lantus.   Hypokalemia Potassium was repleted.          [facility] [place] Controlled Substances Registry was reviewed for this patient prior to discharge.  Consultants: Diabetes education Procedures performed: None  Disposition: Home Diet recommendation: Diabetic   DISCHARGE MEDICATION: Allergies as of 10/10/2022       Reactions   Fish-derived Products Anaphylaxis, Shortness Of Breath   Shellfish Allergy Anaphylaxis, Hives, Other (See Comments)   Iodinated Contrast Media Rash   Iodine Rash   Sulfa Antibiotics Other (See Comments)   Sister said a MD might've  mentioned this might an allergy        Medication List     TAKE these medications    amLODipine 5 MG tablet Commonly known as: NORVASC Take 5 mg by mouth in the morning.   atorvastatin 80 MG tablet Commonly known as: LIPITOR Take 80 mg by mouth at bedtime.   blood glucose meter kit and supplies Dispense based on patient and insurance preference. Use up to four times daily as directed. (FOR ICD-10 E10.9, E11.9).   chlorthalidone 25 MG tablet Commonly known as: HYGROTON Take 12.5 mg by mouth in the morning.   clopidogrel 75 MG tablet Commonly known as: PLAVIX Take 75 mg by mouth in the morning.   divalproex 125 MG capsule Commonly known as: DEPAKOTE SPRINKLE Take 125 mg by mouth in the morning and at bedtime.   insulin glargine 100 UNIT/ML Solostar Pen Commonly known as: LANTUS Inject 10 Units into the skin daily.   losartan 100 MG tablet Commonly known as: COZAAR Take 50 mg by mouth in the morning.   metFORMIN 500 MG tablet Commonly known as: GLUCOPHAGE As directed.   Pen Needles 3/16" 31G X 5 MM Misc Use one needle daily for Lantus injection   potassium chloride SA 20 MEQ tablet Commonly known as: KLOR-CON M Take 20 mEq by mouth in the morning.   QUEtiapine 25 MG tablet Commonly known as: SEROQUEL Take 1 tablet (25 mg total) by mouth at bedtime. What changed:  medication strength how much to take        Follow-up Information     [facility]. Schedule an appointment as soon as possible for a visit in 1 week(s).   Contact information: [address] [place] [address] [phone]                 Discharge Instructions     Discharge instructions   Complete by: As directed    **IMPORTANT DISCHARGE INSTRUCTIONS**   From Dr. Maryfrances Villarreal: You were admitted for high blood sugar  Here, we found that you have diabetes.  Based on your hemoglobin A1c of 12%, your average blood sugar for the last 3 months has been over 300  mg/dL  You should resume your home medicines EXCEPT quetiapine/Seroquel THis medicine is associated with worsening diabetes For now, reduce to the dose to 25 mg nightly (reduce from 50 mg nightly)  AND GO SEE YOUR PRESCRIBER SOON, so they can decide what to do (go back up on the dose or change to a different medicine)  For the new diabetes: Start taking metformin (pill) and glargine insulin (Lantus)(an insulin injection)  You don't want to start metformin too fast or it will cause diarrhea, so start low and increase the dose:  For the next 3 days, take metformin 500 mg (1 tablet) once daily After 3 days, then increase to 500 mg twice daily (once in the morning and once at night) for 3 days Keep increasing the dose every 3-4 days until you are taking 1000 mg (2 tabs) in the morning and 2 tabs at night   ALSO: Take the insulin injection Lantus 10 units once daily in the morning Call your doctor after 3 days if your sugars are still very high, so they can adjust the dose  Check your blood sugar every morning. A "normal" blood sugar in the morning when you wake up is between 70 and 120  A "normal" blood sugar after eating is 120 to 180  If your blood sugar is ever less than 70, you might have symptoms of shakes, fatigue, nausea, dizziness.  If this happens, drink some juice and check your sugar again in 15 minutes.  If the symptoms don't go away, go to the ER.  If your sugar is every greater than 400, call your primary care doctor    Go see your primary care in 1 week   Increase activity slowly   Complete by: As directed        Discharge Exam: Filed Weights   10/09/22 1501 10/09/22 2032  Weight: 111.1 kg 116.3 kg    General: Pt is alert, awake, not in acute distress, sitting up in bed Cardiovascular: RRR, nl S1-S2, no murmurs appreciated.   No LE edema.   Respiratory: Normal respiratory rate and rhythm.  CTAB without rales or wheezes. Abdominal: Abdomen soft and non-tender.   No distension or HSM.   Neuro/Psych: Strength symmetric in upper and lower extremities.  Judgment and insight appear at baseline, slightly impaired.   Condition at discharge: stable  The results of significant diagnostics from this hospitalization (including imaging, microbiology, ancillary and laboratory) are listed below for reference.   Imaging Studies: No results found.  Microbiology: Results for orders placed or performed during the hospital encounter of 10/09/22  MRSA Next Gen by PCR, Nasal     Status: None   Collection Time: 10/09/22  9:47 PM   Specimen: Nasal Mucosa; Nasal Swab  Result Value Ref Range Status   MRSA by PCR Next Gen NOT DETECTED NOT DETECTED Final    Comment: (NOTE) The GeneXpert MRSA  Assay (FDA approved for NASAL specimens only), is one component of a comprehensive MRSA colonization surveillance program. It is not intended to diagnose MRSA infection nor to guide or monitor treatment for MRSA infections. Test performance is not FDA approved in patients less than 54 years old. Performed at [facility], [address], [place], [place] [address]     Labs: CBC: Recent Labs  Lab 10/09/22 1520 10/09/22 1700  WBC 18.3* 16.7*  NEUTROABS 11.9* 10.4*  HGB 15.3 14.7  HCT 47.5 45.1  MCV 90.1 90.6  PLT 298 277   Basic Metabolic Panel: Recent Labs  Lab 10/09/22 1520 10/10/22 0247  NA 132* 138  K 3.4* 2.8*  CL 87* 101  CO2 30 29  GLUCOSE 601* 157*  BUN 13 10  CREATININE 1.18 0.77  CALCIUM 9.6 9.0   Liver Function Tests: No results for input(s): "AST", "ALT", "ALKPHOS", "BILITOT", "PROT", "ALBUMIN" in the last 168 hours. CBG: Recent Labs  Lab 10/10/22 0221 10/10/22 0330 10/10/22 0532 10/10/22 0800 10/10/22 1128  GLUCAP 151* 166* 169* 135* 225*    Discharge time spent: approximately 35 minutes spent on discharge counseling, evaluation of patient on day of discharge, and coordination of discharge planning with nursing,  social work, pharmacy and case management  Signed: Alberteen Sam, MD [facility] 10/10/2022

## 2022-10-10 NOTE — Hospital Course (Addendum)
Patrick Villarreal Is a 57 y.o. M with dementia, lives at home, obesity, HTN, Bipolar d/o, hx CVA who presented with polyuria and polydipsia for few weeks.  Went to PCP who found he had hyperglycemia.   1/8: Glucose 601 mg/dL, no acidosis, Admitted on insulin drip

## 2022-10-10 NOTE — Progress Notes (Signed)
Chaplain received a consult to assist [place] with HCPOA paperwork.  He was in the room with a different provider and chaplain spoke with his sisters outside of the room.  They have already completed HCPOA paperwork completed.  Chaplain assessed for additional support needs, but family stated that they had no additional needs at this time.  [address] [place] Pager, [phone]

## 2024-10-08 ENCOUNTER — Emergency Department (HOSPITAL_COMMUNITY)
Admission: EM | Admit: 2024-10-08 | Discharge: 2024-10-08 | Discharge status: Against Medical Advice | Source: Ambulatory Visit
